# Patient Record
Sex: Female | Born: 1940 | Race: White | Hispanic: No | Smoking: Former smoker
Health system: Southern US, Community
[De-identification: ages and names within clinical notes are randomized; demographics above are authoritative.]

## PROBLEM LIST (undated history)

## (undated) DIAGNOSIS — E785 Hyperlipidemia, unspecified: Secondary | ICD-10-CM

## (undated) HISTORY — DX: Hyperlipidemia, unspecified: E78.5

## (undated) HISTORY — PX: CHOLECYSTECTOMY: SHX55

---

## 2015-12-05 ENCOUNTER — Encounter: Payer: Self-pay | Admitting: Obstetrics & Gynecology

## 2015-12-05 ENCOUNTER — Ambulatory Visit (INDEPENDENT_AMBULATORY_CARE_PROVIDER_SITE_OTHER): Payer: Medicare Other | Admitting: Obstetrics & Gynecology

## 2015-12-05 VITALS — BP 104/72 | HR 80 | Resp 16 | Ht 68.0 in | Wt 166.0 lb

## 2015-12-05 DIAGNOSIS — Z01419 Encounter for gynecological examination (general) (routine) without abnormal findings: Secondary | ICD-10-CM

## 2015-12-05 DIAGNOSIS — Z0000 Encounter for general adult medical examination without abnormal findings: Secondary | ICD-10-CM

## 2015-12-05 DIAGNOSIS — R5382 Chronic fatigue, unspecified: Secondary | ICD-10-CM

## 2015-12-05 NOTE — Progress Notes (Signed)
Subjective:    Gabrielle Torres is a 75 y.o. WW P3(51, 105, and 64 yo kids, 8 grands and 5 greats)  female who presents for an annual exam. The patient has no complaints today except for chronic constipation.  The patient is not currently sexually active. GYN screening history: last pap: was normal. The patient wears seatbelts: yes. The patient participates in regular exercise: yes. Has the patient ever been transfused or tattooed?: no. The patient reports that there is not domestic violence in her life.   Menstrual History: OB History    Gravida Para Term Preterm AB TAB SAB Ectopic Multiple Living   3         3      Menarche age: 80  No LMP recorded. Patient is postmenopausal.    The following portions of the patient's history were reviewed and updated as appropriate: allergies, current medications, past family history, past medical history, past social history, past surgical history and problem list.  Review of Systems Pertinent items noted in HPI and remainder of comprehensive ROS otherwise negative. Widowed for 2 years. Moved here from [place] to be with her son. S/p colonoscopy in [place].   Objective:    BP 104/72 mmHg  Pulse 80  Resp 16  Ht 5\' 8"  (1.727 m)  Wt 166 lb (75.297 kg)  BMI 25.25 kg/m2  General Appearance:    Alert, cooperative, no distress, appears stated age  Head:    Normocephalic, without obvious abnormality, atraumatic  Eyes:    PERRL, conjunctiva/corneas clear, EOM's intact, fundi    benign, both eyes  Ears:    Normal TM's and external ear canals, both ears  Nose:   Nares normal, septum midline, mucosa normal, no drainage    or sinus tenderness  Throat:   Lips, mucosa, and tongue normal; teeth and gums normal  Neck:   Supple, symmetrical, trachea midline, no adenopathy;    thyroid:  no enlargement/tenderness/nodules; no carotid   bruit or JVD  Back:     Symmetric, no curvature, ROM normal, no CVA tenderness  Lungs:     Clear to auscultation bilaterally, respirations  unlabored  Chest Wall:    No tenderness or deformity   Heart:    Regular rate and rhythm, S1 and S2 normal, no murmur, rub   or gallop  Breast Exam:    No tenderness, masses, or nipple abnormality  Abdomen:     Soft, non-tender, bowel sounds active all four quadrants,    no masses, no organomegaly  Genitalia:    Normal female without lesion, discharge or tenderness, moderate atrophy, NSSA, NT, mobile, no palpable adnexa/masses     Extremities:   Extremities normal, atraumatic, no cyanosis or edema  Pulses:   2+ and symmetric all extremities  Skin:   Skin color, texture, turgor normal, no rashes or lesions  Lymph nodes:   Cervical, supraclavicular, and axillary nodes normal  Neurologic:   CNII-XII intact, normal strength, sensation and reflexes    throughout  .    Assessment:    Healthy female exam.    Plan:     Breast self exam technique reviewed and patient encouraged to perform self-exam monthly. Mammogram. DEXA   Refer to GI for colorectal cancer screening

## 2015-12-06 ENCOUNTER — Other Ambulatory Visit: Payer: Medicare Other

## 2015-12-07 ENCOUNTER — Other Ambulatory Visit: Payer: Medicare Other

## 2015-12-12 ENCOUNTER — Other Ambulatory Visit: Payer: Medicare Other

## 2015-12-20 ENCOUNTER — Other Ambulatory Visit: Payer: Self-pay | Admitting: Obstetrics & Gynecology

## 2015-12-20 DIAGNOSIS — Z1231 Encounter for screening mammogram for malignant neoplasm of breast: Secondary | ICD-10-CM

## 2015-12-20 DIAGNOSIS — E2839 Other primary ovarian failure: Secondary | ICD-10-CM

## 2015-12-22 ENCOUNTER — Other Ambulatory Visit (INDEPENDENT_AMBULATORY_CARE_PROVIDER_SITE_OTHER): Payer: Medicare Other

## 2015-12-22 DIAGNOSIS — Z01419 Encounter for gynecological examination (general) (routine) without abnormal findings: Secondary | ICD-10-CM | POA: Diagnosis not present

## 2015-12-22 DIAGNOSIS — Z0000 Encounter for general adult medical examination without abnormal findings: Secondary | ICD-10-CM | POA: Diagnosis not present

## 2015-12-23 LAB — COMPREHENSIVE METABOLIC PANEL
ALK PHOS: 79 U/L (ref 33–130)
ALT: 17 U/L (ref 6–29)
AST: 22 U/L (ref 10–35)
Albumin: 4.2 g/dL (ref 3.6–5.1)
BUN: 16 mg/dL (ref 7–25)
CHLORIDE: 106 mmol/L (ref 98–110)
CO2: 26 mmol/L (ref 20–31)
CREATININE: 0.89 mg/dL (ref 0.60–0.93)
Calcium: 9.8 mg/dL (ref 8.6–10.4)
GLUCOSE: 89 mg/dL (ref 65–99)
POTASSIUM: 4.7 mmol/L (ref 3.5–5.3)
SODIUM: 141 mmol/L (ref 135–146)
Total Bilirubin: 0.6 mg/dL (ref 0.2–1.2)
Total Protein: 6.8 g/dL (ref 6.1–8.1)

## 2015-12-23 LAB — LIPID PANEL
CHOL/HDL RATIO: 4 ratio (ref <=5.0)
Cholesterol: 218 mg/dL — ABNORMAL HIGH (ref 125–200)
HDL: 55 mg/dL (ref >=46)
LDL Cholesterol: 120 mg/dL (ref <130)
Triglycerides: 215 mg/dL — ABNORMAL HIGH (ref <150)
VLDL: 43 mg/dL — AB (ref <30)

## 2015-12-23 LAB — CBC
HCT: 40 % (ref 36.0–46.0)
Hemoglobin: 13.3 g/dL (ref 12.0–15.0)
MCH: 30.4 pg (ref 26.0–34.0)
MCHC: 33.3 g/dL (ref 30.0–36.0)
MCV: 91.5 fL (ref 78.0–100.0)
MPV: 10.6 fL (ref 8.6–12.4)
PLATELETS: 195 10*3/uL (ref 150–400)
RBC: 4.37 MIL/uL (ref 3.87–5.11)
RDW: 14.2 % (ref 11.5–15.5)
WBC: 4.5 10*3/uL (ref 4.0–10.5)

## 2015-12-23 LAB — VITAMIN D 25 HYDROXY (VIT D DEFICIENCY, FRACTURES): VIT D 25 HYDROXY: 26 ng/mL — AB (ref 30–100)

## 2015-12-23 LAB — TSH: TSH: 4.29 u[IU]/mL (ref 0.350–4.500)

## 2015-12-26 ENCOUNTER — Telehealth: Payer: Self-pay | Admitting: *Deleted

## 2015-12-26 DIAGNOSIS — R7989 Other specified abnormal findings of blood chemistry: Secondary | ICD-10-CM

## 2015-12-26 MED ORDER — VITAMIN D (ERGOCALCIFEROL) 1.25 MG (50000 UNIT) PO CAPS: 50000.0000 [IU] | ORAL_CAPSULE | ORAL | Status: DC

## 2015-12-26 NOTE — Telephone Encounter (Signed)
-----   Message from Emily Filbert, MD sent at 12/26/2015 10:20 AM EST ----- She will need to see her FP about her lipids. She will need a prescription for Vitamin 50,000 weekly for 6 weeks. Thanks

## 2015-12-26 NOTE — Telephone Encounter (Signed)
Printed labs to send to pt - called pt to adv VitD at pharm and referred her to [facility] here in [place] for PCP follow up lipids. Pt expressed understanding.

## 2015-12-28 ENCOUNTER — Encounter: Payer: Self-pay | Admitting: Physician Assistant

## 2015-12-28 ENCOUNTER — Ambulatory Visit (INDEPENDENT_AMBULATORY_CARE_PROVIDER_SITE_OTHER): Payer: Medicare Other | Admitting: Physician Assistant

## 2015-12-28 VITALS — BP 108/61 | HR 71 | Ht 68.0 in | Wt 164.0 lb

## 2015-12-28 DIAGNOSIS — E785 Hyperlipidemia, unspecified: Secondary | ICD-10-CM | POA: Insufficient documentation

## 2015-12-28 DIAGNOSIS — E559 Vitamin D deficiency, unspecified: Secondary | ICD-10-CM | POA: Diagnosis not present

## 2015-12-28 DIAGNOSIS — Z23 Encounter for immunization: Secondary | ICD-10-CM | POA: Diagnosis not present

## 2015-12-28 DIAGNOSIS — Z1211 Encounter for screening for malignant neoplasm of colon: Secondary | ICD-10-CM

## 2015-12-28 MED ORDER — AMBULATORY NON FORMULARY MEDICATION: Status: DC

## 2015-12-28 MED ORDER — PRAVASTATIN SODIUM 40 MG PO TABS: 40.0000 mg | ORAL_TABLET | Freq: Every day | ORAL | Status: DC

## 2015-12-28 NOTE — Patient Instructions (Signed)
Varicella-Zoster Virus Vaccine Live injection  What is this medicine?  VARICELLA VIRUS VACCINE (var uh SEL uh VAHY ruhs vak SEEN) is used to prevent infections of chickenpox.  HERPES ZOSTER VIRUS VACCINE (HUR peez ZOS ter vahy ruhs vak SEEN) is used to prevent shingles in adults 73 years old and over. This vaccine is not used to treat shingles or nerve pain from shingles.  These medicines may be used for other purposes; ask your health care provider or pharmacist if you have questions.  This medicine may be used for other purposes; ask your health care provider or pharmacist if you have questions.  What should I tell my health care provider before I take this medicine?  They need to know if you have any of the following conditions:  -blood disorders or disease  -cancer like leukemia or lymphoma  -immune system problems or therapy  -infection with fever  -recent immune globulin therapy  -tuberculosis  -an unusual or allergic reaction to vaccines, neomycin, gelatin, other medicines, foods, dyes, or preservatives  -pregnant or trying to get pregnant  -breast-feeding  How should I use this medicine?  These vaccines are for injection under the skin. They are given by a health care professional.  A copy of Vaccine Information Statements will be given before each varicella virus vaccination. Read this sheet carefully each time. The sheet may change frequently. A Vaccine Information Statement is not given before the herpes zoster virus vaccine.  Talk to your pediatrician regarding the use of the varicella virus vaccine in children. While this drug may be prescribed for children as young as 12 months of age for selected conditions, precautions do apply. The herpes zoster virus vaccine is not approved in children.  Overdosage: If you think you have taken too much of this medicine contact a poison control center or emergency room at once.  NOTE: This medicine is only for you. Do not share this medicine with others.  What if I  miss a dose?  Keep appointments for follow-up (booster) doses of varicella virus vaccine as directed. It is important not to miss your dose. Call your doctor or health care professional if you are unable to keep an appointment.  Follow-up (booster) doses are not needed for the herpes zoster virus vaccine.  What may interact with this medicine?  Do not take these medicines with any of the following medications:  -adalimumab  -anakinra  -etanercept  -infliximab  -medicines that suppress your immune system  -medicines to treat cancer  These medicines may also interact with the following medications:  -aspirin and aspirin-like medicines (varicella virus vaccine only)  -blood transfusions (varicella virus vaccine only)  -immunoglobulins (varicella virus vaccine only)  -steroid medicines like prednisone or cortisone  This list may not describe all possible interactions. Give your health care provider a list of all the medicines, herbs, non-prescription drugs, or dietary supplements you use. Also tell them if you smoke, drink alcohol, or use illegal drugs. Some items may interact with your medicine.  What should I watch for while using this medicine?  Visit your doctor for regular check ups.  These vaccines, like all vaccines, may not fully protect everyone.  After receiving these vaccines it may be possible to pass chickenpox infection to others. For up to 6 weeks, avoid people with immune system problems, pregnant women who have not had chickenpox, newborns of women who have not had chickenpox, and all newborns born at less than 28 weeks of pregnancy. Talk to your   doctor for more information.  Do not become pregnant for 3 months after taking these vaccines. Women should inform their doctor if they wish to become pregnant or think they might be pregnant. There is a potential for serious side effects to an unborn child. Talk to your health care professional or pharmacist for more information.  What side effects may I  notice from receiving this medicine?  Side effects that you should report to your doctor or health care professional as soon as possible:  -allergic reactions like skin rash, itching or hives, swelling of the face, lips, or tongue  -breathing problems  -extreme changes in behavior  -feeling faint or lightheaded, falls  -fever over 102 degrees F  -pain, tingling, numbness in the hands or feet  -redness, blistering, peeling or loosening of the skin, including inside the mouth  -seizures  -unusually weak or tired  Side effects that usually do not require medical attention (report to your doctor or health care professional if they continue or are bothersome):  -aches or pains  -chickenpox-like rash  -diarrhea  -headache  -low-grade fever under 102 degrees F  -loss of appetite  -nausea, vomiting  -redness, pain, swelling at site where injected  -sleepy  -trouble sleeping  This list may not describe all possible side effects. Call your doctor for medical advice about side effects. You may report side effects to FDA at 1-800-FDA-1088.  Where should I keep my medicine?  These drugs are given in a hospital or clinic and will not be stored at home.  NOTE: This sheet is a summary. It may not cover all possible information. If you have questions about this medicine, talk to your doctor, pharmacist, or health care provider.     © 2016, Elsevier/Gold Standard. (2013-07-24 14:24:35)

## 2015-12-28 NOTE — Progress Notes (Signed)
   Subjective:    Patient ID: Gabrielle Torres, female    DOB: [birth year], 75 y.o.   MRN: [id number]  HPI Pt is a 75 yo female who presents to the clinic to establish care. She was recently seen by GYN and found to have elevated LDL. She would like to discuss today.   .. Active Ambulatory Problems    Diagnosis Date Noted  . Vitamin D deficiency 12/28/2015  . Hyperlipidemia 12/28/2015   Resolved Ambulatory Problems    Diagnosis Date Noted  . No Resolved Ambulatory Problems   No Additional Past Medical History   .Marland Kitchen Family History  Problem Relation Age of Onset  . Breast cancer Mother 24  . Hypertension Son   . Hypertension Paternal Aunt   . Depression Father   . Suicidality Father    .Marland Kitchen Social History   Social History  . Marital Status: Widowed    Spouse Name: N/A  . Number of Children: N/A  . Years of Education: N/A   Occupational History  . Not on file.   Social History Main Topics  . Smoking status: Never Smoker   . Smokeless tobacco: Not on file  . Alcohol Use: No     Comment: Occasional  . Drug Use: No  . Sexual Activity: No   Other Topics Concern  . Not on file   Social History Narrative      Review of Systems  All other systems reviewed and are negative.      Objective:   Physical Exam  Constitutional: She is oriented to person, place, and time. She appears well-developed and well-nourished.  HENT:  Head: Normocephalic and atraumatic.  Neck: Normal range of motion. Neck supple.  Cardiovascular: Normal rate, regular rhythm and normal heart sounds.   Pulmonary/Chest: Effort normal and breath sounds normal. She has no wheezes.  Lymphadenopathy:    She has no cervical adenopathy.  Neurological: She is alert and oriented to person, place, and time.  Skin: Skin is dry.  Psychiatric: She has a normal mood and affect. Her behavior is normal.          Assessment & Plan:  Hyperlipidemia- CV 10 yr risk 8.7 percent. Started on pravachol daily.  Discussed side effects. Follow up in 1 years. Low fat diet and exercise encouraged.   Vitamin d def- on high dose vitamin d. Recheck in 3 months.   tdap and prevnar 13 given today.   zostavax immunization order given to have done 4 weeks at least after prevnar.   mammo and dexa ordered.   Sent referral for colonoscopy.

## 2015-12-29 ENCOUNTER — Ambulatory Visit (INDEPENDENT_AMBULATORY_CARE_PROVIDER_SITE_OTHER): Payer: Medicare Other

## 2015-12-29 DIAGNOSIS — Z780 Asymptomatic menopausal state: Secondary | ICD-10-CM | POA: Diagnosis not present

## 2015-12-29 DIAGNOSIS — E2839 Other primary ovarian failure: Secondary | ICD-10-CM

## 2015-12-29 DIAGNOSIS — Z1231 Encounter for screening mammogram for malignant neoplasm of breast: Secondary | ICD-10-CM

## 2015-12-29 DIAGNOSIS — M8588 Other specified disorders of bone density and structure, other site: Secondary | ICD-10-CM | POA: Diagnosis not present

## 2015-12-29 DIAGNOSIS — M8580 Other specified disorders of bone density and structure, unspecified site: Secondary | ICD-10-CM | POA: Diagnosis not present

## 2016-02-02 DIAGNOSIS — D123 Benign neoplasm of transverse colon: Secondary | ICD-10-CM | POA: Diagnosis not present

## 2016-02-02 DIAGNOSIS — D122 Benign neoplasm of ascending colon: Secondary | ICD-10-CM | POA: Diagnosis not present

## 2016-02-02 DIAGNOSIS — D120 Benign neoplasm of cecum: Secondary | ICD-10-CM | POA: Diagnosis not present

## 2016-02-02 DIAGNOSIS — Z1211 Encounter for screening for malignant neoplasm of colon: Secondary | ICD-10-CM | POA: Diagnosis not present

## 2016-02-02 LAB — HM COLONOSCOPY

## 2016-02-07 ENCOUNTER — Telehealth: Payer: Self-pay | Admitting: *Deleted

## 2016-02-07 DIAGNOSIS — E559 Vitamin D deficiency, unspecified: Secondary | ICD-10-CM

## 2016-02-07 NOTE — Telephone Encounter (Signed)
Labs ordered to recheck vit d since pt has finished weekly rx.

## 2016-02-08 DIAGNOSIS — E559 Vitamin D deficiency, unspecified: Secondary | ICD-10-CM | POA: Diagnosis not present

## 2016-02-09 LAB — VITAMIN D 25 HYDROXY (VIT D DEFICIENCY, FRACTURES): Vit D, 25-Hydroxy: 37 ng/mL (ref 30–100)

## 2016-02-29 ENCOUNTER — Encounter: Payer: Self-pay | Admitting: Physician Assistant

## 2016-05-07 ENCOUNTER — Ambulatory Visit (INDEPENDENT_AMBULATORY_CARE_PROVIDER_SITE_OTHER): Payer: Medicare Other | Admitting: Physician Assistant

## 2016-05-07 ENCOUNTER — Encounter: Payer: Self-pay | Admitting: Physician Assistant

## 2016-05-07 VITALS — BP 99/54 | HR 72 | Ht 68.0 in | Wt 169.0 lb

## 2016-05-07 DIAGNOSIS — E785 Hyperlipidemia, unspecified: Secondary | ICD-10-CM | POA: Diagnosis not present

## 2016-05-07 DIAGNOSIS — G5701 Lesion of sciatic nerve, right lower limb: Secondary | ICD-10-CM | POA: Diagnosis not present

## 2016-05-07 DIAGNOSIS — Z780 Asymptomatic menopausal state: Secondary | ICD-10-CM

## 2016-05-07 DIAGNOSIS — D126 Benign neoplasm of colon, unspecified: Secondary | ICD-10-CM

## 2016-05-07 DIAGNOSIS — E559 Vitamin D deficiency, unspecified: Secondary | ICD-10-CM

## 2016-05-07 MED ORDER — IBUPROFEN 600 MG PO TABS: 600.0000 mg | ORAL_TABLET | Freq: Three times a day (TID) | ORAL | Status: DC | PRN

## 2016-05-07 NOTE — Patient Instructions (Signed)
Piriformis Syndrome With Rehab Piriformis syndrome is a condition the affects the nervous system in the area of the hip, and is characterized by pain and possibly a loss of feeling in the backside (posterior) thigh that may extend down the entire length of the leg. The symptoms are caused by an increase in pressure on the sciatic nerve by the piriformis muscle, which is on the back of the hip and is responsible for externally rotating the hip. The sciatic nerve and its branches connect to much of the leg. Normally the sciatic nerve runs between the piriformis muscle and other muscles. However, in certain individuals the nerve runs through the muscle, which causes an increase in pressure on the nerve and results in the symptoms of piriformis syndrome. SYMPTOMS   Pain, tingling, numbness, or burning in the back of the thigh that may also extend down the entire leg.  Occasionally, tenderness in the buttock.  Loss of function of the leg.  Pain that worsens when using the piriformis muscle (running, jumping, or stairs).  Pain that increases with prolonged sitting.  Pain that is lessened by lying flat on the back. CAUSES   Piriformis syndrome is the result of an increase in pressure placed on the sciatic nerve. Oftentimes, piriformis syndrome is an overuse injury.  Stress placed on the nerve from a sudden increase in the intensity, frequency, or duration of training.  Compensation of other extremity injuries. RISK INCREASES WITH:  Sports that involve the piriformis muscle (running, walking, or jumping).  You are born with (congenital) a defect in which the sciatic nerve passes through the muscle. PREVENTION  Warm up and stretch properly before activity.  Allow for adequate recovery between workouts.  Maintain physical fitness:  Strength, flexibility, and endurance.  Cardiovascular fitness. PROGNOSIS  If treated properly, the symptoms of piriformis syndrome usually resolve in 2 to 6  weeks. RELATED COMPLICATIONS   Persistent and possibly permanent pain and numbness in the lower extremity.  Weakness of the extremity that may progress to disability and inability to compete. TREATMENT  The most effective treatment for piriformis syndrome is rest from any activities that aggravate the symptoms. Ice and pain medication may help reduce pain and inflammation. The use of strengthening and stretching exercises may help reduce pain with activity. These exercises may be performed at home or with a therapist. A referral to a therapist may be given for further evaluation and treatment, such as ultrasound. Corticosteroid injections may be given to reduce inflammation that is causing pressure to be placed on the sciatic nerve. If nonsurgical (conservative) treatment is unsuccessful, then surgery may be recommended.  MEDICATION   If pain medication is necessary, then nonsteroidal anti-inflammatory medications, such as aspirin and ibuprofen, or other minor pain relievers, such as acetaminophen, are often recommended.  Do not take pain medication for 7 days before surgery.  Prescription pain relievers may be given if deemed necessary by your caregiver. Use only as directed and only as much as you need.  Corticosteroid injections may be given by your caregiver. These injections should be reserved for the most serious cases, because they may only be given a certain number of times. HEAT AND COLD:   Cold treatment (icing) relieves pain and reduces inflammation. Cold treatment should be applied for 10 to 15 minutes every 2 to 3 hours for inflammation and pain and immediately after any activity that aggravates your symptoms. Use ice packs or massage the area with a piece of ice (ice massage).  Heat   treatment may be used prior to performing the stretching and strengthening activities prescribed by your caregiver, physical therapist, or athletic trainer. Use a heat pack or soak the injury in warm  water. SEEK IMMEDIATE MEDICAL CARE IF:  Treatment seems to offer no benefit, or the condition worsens.  Any medications produce adverse side effects. EXERCISES RANGE OF MOTION (ROM) AND STRETCHING EXERCISES - Piriformis Syndrome These exercises may help you when beginning to rehabilitate your injury. Your symptoms may resolve with or without further involvement from your physician, physical therapist, or athletic trainer. While completing these exercises, remember:   Restoring tissue flexibility helps normal motion to return to the joints. This allows healthier, less painful movement and activity.  An effective stretch should be held for at least 30 seconds.  A stretch should never be painful. You should only feel a gentle lengthening or release in the stretched tissue. STRETCH - Hip Rotators  Lie on your back on a firm surface. Grasp your right / left knee with your right / left hand and your ankle with your opposite hand.  Keeping your hips and shoulders firmly planted, gently pull your right / left knee and rotate your lower leg toward your opposite shoulder until you feel a stretch in your buttocks.  Hold this stretch for __________ seconds. Repeat this stretch __________ times. Complete this stretch __________ times per day. STRETCH - Iliotibial Band  On the floor or bed, lie on your side so your right / left leg is on top. Bend your knee and grab your ankle.  Slowly bring your knee back so that your thigh is in line with your trunk. Keep your heel at your buttocks and gently arch your back so your head, shoulders, and hips line up.  Slowly lower your leg so that your knee approaches the floor/bed until you feel a gentle stretch on the outside of your right / left thigh. If you do not feel a stretch and your knee will not fall farther, place the heel of your opposite foot on top of your knee and pull your thigh down farther.  Hold this stretch for __________ seconds. Repeat  __________ times. Complete __________ times per day. STRENGTHENING EXERCISES - Piriformis Syndrome  These are some of the caregiver again or until your symptoms are resolved. Remember:   Strong muscles with good endurance tolerate stress better.  Do the exercises as initially prescribed by your caregiver. Progress slowly with each exercise, gradually increasing the number of repetitions and weight used under their guidance. STRENGTH - Hip Abductors, Straight Leg Raises Be aware of your form throughout the entire exercise so that you exercise the correct muscles. Sloppy form means that you are not strengthening the correct muscles.  Lie on your side so that your head, shoulders, knee, and hip line up. You may bend your lower knee to help maintain your balance. Your right / left leg should be on top.  Roll your hips slightly forward, so that your hips are stacked directly over each other and your right / left knee is facing forward.  Lift your top leg up 4-6 inches, leading with your heel. Be sure that your foot does not drift forward or that your knee does not roll toward the ceiling.  Hold this position for __________ seconds. You should feel the muscles in your outer hip lifting (you may not notice this until your leg begins to tire).  Slowly lower your leg to the starting position. Allow the muscles to fully   relax before beginning the next repetition. Repeat __________ times. Complete this exercise __________ times per day.  STRENGTH - Hip Abductors, Quadruped  On a firm, lightly padded surface, position yourself on your hands and knees. Your hands should be directly below your shoulders and your knees should be directly below your hips.  Keeping your right / left knee bent, lift your leg out to the side. Keep your legs level and in line with your shoulders.  Position yourself on your hands and knees.  Hold for __________ seconds.  Keeping your trunk steady and your hips level, slowly  lower your leg to the starting position. Repeat __________ times. Complete this exercise __________ times per day.  STRENGTH - Hip Abductors, Standing  Tie one end of a rubber exercise band/tubing to a secure surface (table, pole) and tie a loop at the other end.  Place the loop around your right / left ankle. Keeping your ankle with the band directly opposite of the secured end, step away until there is tension in the tube/band.  Hold onto a chair as needed for balance.  Keeping your back upright, your shoulders over your hips, and your toes pointing forward, lift your right / left leg out to your side. Be sure to lift your leg with your hip muscles. Do not "throw" your leg or tip your body to lift your leg.  Slowly and with control, return to the starting position. Repeat exercise __________ times. Complete this exercise __________ times per day.    This information is not intended to replace advice given to you by your health care provider. Make sure you discuss any questions you have with your health care provider.   Document Released: 11/19/2005 Document Revised: 04/05/2015 Document Reviewed: 03/03/2009 Elsevier Interactive Patient Education 2016 [employer]vier Inc.  

## 2016-05-08 DIAGNOSIS — E785 Hyperlipidemia, unspecified: Secondary | ICD-10-CM | POA: Diagnosis not present

## 2016-05-08 DIAGNOSIS — Z780 Asymptomatic menopausal state: Secondary | ICD-10-CM | POA: Diagnosis not present

## 2016-05-08 DIAGNOSIS — E559 Vitamin D deficiency, unspecified: Secondary | ICD-10-CM | POA: Diagnosis not present

## 2016-05-08 LAB — LIPID PANEL
CHOLESTEROL: 172 mg/dL (ref 125–200)
HDL: 60 mg/dL (ref >=46)
LDL CALC: 73 mg/dL (ref <130)
TRIGLYCERIDES: 196 mg/dL — AB (ref <150)
Total CHOL/HDL Ratio: 2.9 Ratio (ref <=5.0)
VLDL: 39 mg/dL — AB (ref <30)

## 2016-05-09 DIAGNOSIS — G5701 Lesion of sciatic nerve, right lower limb: Secondary | ICD-10-CM | POA: Insufficient documentation

## 2016-05-09 DIAGNOSIS — Z780 Asymptomatic menopausal state: Secondary | ICD-10-CM | POA: Insufficient documentation

## 2016-05-09 LAB — VITAMIN D 25 HYDROXY (VIT D DEFICIENCY, FRACTURES): Vit D, 25-Hydroxy: 42 ng/mL (ref 30–100)

## 2016-05-09 NOTE — Progress Notes (Signed)
   Subjective:    Patient ID: Gabrielle Torres, female    DOB: August 10, 1941, 75 y.o.   MRN: [id number]  HPI Pt is a 75 yo female who presents to the clinic with 1 month of "right hip pain". She reports pain in right buttocks and lateral hip. Pain does not radiate into legs. No numbness or tingling. Right leg does not give out. Patient is very active and walks a lot. 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 Pain seems to be the worse after sitting for long periods of time. Not tried anything to make better.   She started pravachol a few months ago and wants labs rechecked.    Review of Systems  All other systems reviewed and are negative.      Objective:   Physical Exam  Constitutional: She appears well-developed and well-nourished.  HENT:  Head: Normocephalic and atraumatic.  Musculoskeletal:  Very mild tenderness with palpation over greater trochanteric bursa of right hip.  More moderate pain just behind bursa at insertion of piriformis muscle.  Negative straight leg test.  No tenderness over lumbar spine.  Mild discomfort with external ROM of right hip.  5/5 strength bilateral lower extremity.   Psychiatric: She has a normal mood and affect. Her behavior is normal.          Assessment & Plan:  Right piriformis muscle strain- hx also positive for some mild greater trochanteric bursitis. gave HO on stretches to do twice a day. Ice area. Ibuprofen regularly for next 2 weeks. biofreeze encouraged to use. Could benefit from sitting on tennis ball and letting roll over buttocks. Consider formal PT. Call office if no improvement in next 2-3 weeks.   Hyperlipidemia- on pravachol now. Recheck lipid level.   Vitamin D def- recheck vitamin d level.   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 Follow up in 3 years.

## 2016-05-23 ENCOUNTER — Encounter: Payer: Self-pay | Admitting: Physician Assistant

## 2017-01-01 ENCOUNTER — Other Ambulatory Visit: Payer: Self-pay | Admitting: Physician Assistant

## 2017-01-09 ENCOUNTER — Ambulatory Visit (INDEPENDENT_AMBULATORY_CARE_PROVIDER_SITE_OTHER): Payer: Medicare Other | Admitting: Physician Assistant

## 2017-01-09 ENCOUNTER — Encounter: Payer: Self-pay | Admitting: Physician Assistant

## 2017-01-09 VITALS — BP 101/68 | HR 94 | Ht 68.0 in | Wt 157.0 lb

## 2017-01-09 DIAGNOSIS — Z780 Asymptomatic menopausal state: Secondary | ICD-10-CM

## 2017-01-09 DIAGNOSIS — Z0000 Encounter for general adult medical examination without abnormal findings: Secondary | ICD-10-CM

## 2017-01-09 DIAGNOSIS — Z23 Encounter for immunization: Secondary | ICD-10-CM

## 2017-01-09 DIAGNOSIS — E781 Pure hyperglyceridemia: Secondary | ICD-10-CM | POA: Insufficient documentation

## 2017-01-09 DIAGNOSIS — E7800 Pure hypercholesterolemia, unspecified: Secondary | ICD-10-CM | POA: Diagnosis not present

## 2017-01-09 LAB — COMPLETE METABOLIC PANEL WITH GFR
ALT: 15 U/L (ref 6–29)
AST: 22 U/L (ref 10–35)
Albumin: 4.3 g/dL (ref 3.6–5.1)
Alkaline Phosphatase: 80 U/L (ref 33–130)
BUN: 17 mg/dL (ref 7–25)
CALCIUM: 9.8 mg/dL (ref 8.6–10.4)
CHLORIDE: 107 mmol/L (ref 98–110)
CO2: 24 mmol/L (ref 20–31)
CREATININE: 1.08 mg/dL — AB (ref 0.60–0.93)
GFR, EST AFRICAN AMERICAN: 59 mL/min — AB (ref >=60)
GFR, EST NON AFRICAN AMERICAN: 51 mL/min — AB (ref >=60)
Glucose, Bld: 81 mg/dL (ref 65–99)
Potassium: 4.3 mmol/L (ref 3.5–5.3)
Sodium: 141 mmol/L (ref 135–146)
Total Bilirubin: 0.6 mg/dL (ref 0.2–1.2)
Total Protein: 7 g/dL (ref 6.1–8.1)

## 2017-01-09 LAB — LIPID PANEL
CHOLESTEROL: 162 mg/dL (ref <200)
HDL: 71 mg/dL (ref >50)
LDL CALC: 66 mg/dL (ref <100)
TRIGLYCERIDES: 125 mg/dL (ref <150)
Total CHOL/HDL Ratio: 2.3 Ratio (ref <5.0)
VLDL: 25 mg/dL (ref <30)

## 2017-01-09 MED ORDER — PRAVASTATIN SODIUM 40 MG PO TABS: 40.0000 mg | ORAL_TABLET | Freq: Every day | ORAL | 4 refills | Status: DC

## 2017-01-09 NOTE — Progress Notes (Signed)
   Subjective:    Patient ID: Gabrielle Torres, female    DOB: 07-Nov-1941, 76 y.o.   MRN: [id number]  HPI Pt is a 76 yo female who presents to the clinic for medication refill. She is almost out of her Pravachol. She is doing well with no complaints. She has started fish oil for elevated TG.   .. Active Ambulatory Problems    Diagnosis Date Noted  . Vitamin D deficiency 12/28/2015  . Hyperlipidemia 12/28/2015  . Adenomatous polyp of colon 05/07/2016  . Post-menopausal 05/09/2016  . Piriformis syndrome of right side 05/09/2016  . Hypertriglyceridemia 01/09/2017   Resolved Ambulatory Problems    Diagnosis Date Noted  . No Resolved Ambulatory Problems   No Additional Past Medical History   .Marland KitchenBreast Cancer-relatedfamily history includes Breast cancer (age of onset: 66) in her mother.   .. Social History   Social History  . Marital status: Widowed    Spouse name: N/A  . Number of children: N/A  . Years of education: N/A   Occupational History  . Not on file.   Social History Main Topics  . Smoking status: Never Smoker  . Smokeless tobacco: Never Used  . Alcohol use No     Comment: Occasional  . Drug use: No  . Sexual activity: No   Other Topics Concern  . Not on file   Social History Narrative  . No narrative on file     Review of Systems  All other systems reviewed and are negative.      Objective:   Physical Exam  Constitutional: She is oriented to person, place, and time. She appears well-developed and well-nourished.  HENT:  Head: Normocephalic and atraumatic.  Neck: Normal range of motion. Neck supple.  Cardiovascular: Normal rate, regular rhythm and normal heart sounds.   Pulmonary/Chest: Effort normal and breath sounds normal.  Lymphadenopathy:    She has no cervical adenopathy.  Neurological: She is alert and oriented to person, place, and time.  Psychiatric: She has a normal mood and affect. Her behavior is normal.          Assessment & Plan:   .Marland KitchenZaylie was seen today for hyperlipidemia.  Diagnoses and all orders for this visit:  Preventative health care  Pure hypercholesterolemia -     Lipid panel  Hypertriglyceridemia -     Lipid panel -     COMPLETE METABOLIC PANEL WITH GFR  Post-menopausal  Influenza vaccine needed -     Flu Vaccine QUAD 36+ mos PF IM (Fluarix & Fluzone Quad PF)  Need for pneumococcal vaccination -     Pneumococcal polysaccharide vaccine 23-valent greater than or equal to 2yo subcutaneous/IM  Other orders -     pravastatin (PRAVACHOL) 40 MG tablet; Take 1 tablet (40 mg total) by mouth daily.    pravachol refilled for 1 year.  Dicussed taking baby ASA 81mg  for heart health.  Vaccines up date

## 2017-01-09 NOTE — Patient Instructions (Signed)

## 2017-01-24 ENCOUNTER — Telehealth: Payer: Self-pay | Admitting: *Deleted

## 2017-01-24 DIAGNOSIS — R944 Abnormal results of kidney function studies: Secondary | ICD-10-CM

## 2017-01-24 NOTE — Telephone Encounter (Signed)
CMP ordered to recheck kidney function.

## 2017-02-05 DIAGNOSIS — R944 Abnormal results of kidney function studies: Secondary | ICD-10-CM | POA: Diagnosis not present

## 2017-02-05 LAB — COMPLETE METABOLIC PANEL WITH GFR
ALT: 13 U/L (ref 6–29)
AST: 17 U/L (ref 10–35)
Albumin: 4.3 g/dL (ref 3.6–5.1)
Alkaline Phosphatase: 88 U/L (ref 33–130)
BILIRUBIN TOTAL: 0.4 mg/dL (ref 0.2–1.2)
BUN: 17 mg/dL (ref 7–25)
CHLORIDE: 106 mmol/L (ref 98–110)
CO2: 27 mmol/L (ref 20–31)
CREATININE: 1.01 mg/dL — AB (ref 0.60–0.93)
Calcium: 9.4 mg/dL (ref 8.6–10.4)
GFR, Est African American: 64 mL/min (ref >=60)
GFR, Est Non African American: 55 mL/min — ABNORMAL LOW (ref >=60)
GLUCOSE: 88 mg/dL (ref 65–99)
Potassium: 4.4 mmol/L (ref 3.5–5.3)
Sodium: 140 mmol/L (ref 135–146)
TOTAL PROTEIN: 7 g/dL (ref 6.1–8.1)

## 2017-02-06 ENCOUNTER — Encounter: Payer: Self-pay | Admitting: Physician Assistant

## 2017-02-06 DIAGNOSIS — N183 Chronic kidney disease, stage 3 unspecified: Secondary | ICD-10-CM | POA: Insufficient documentation

## 2017-02-06 DIAGNOSIS — N1831 Chronic kidney disease, stage 3a: Secondary | ICD-10-CM | POA: Insufficient documentation

## 2017-02-07 ENCOUNTER — Other Ambulatory Visit: Payer: Self-pay

## 2017-02-07 DIAGNOSIS — E785 Hyperlipidemia, unspecified: Secondary | ICD-10-CM

## 2017-02-07 MED ORDER — PRAVASTATIN SODIUM 40 MG PO TABS: 40.0000 mg | ORAL_TABLET | Freq: Every day | ORAL | 4 refills | Status: DC

## 2017-02-14 ENCOUNTER — Emergency Department (INDEPENDENT_AMBULATORY_CARE_PROVIDER_SITE_OTHER): Admission: EM | Admit: 2017-02-14 | Discharge: 2017-02-14 | Payer: Medicare Other | Source: Home / Self Care

## 2017-02-14 ENCOUNTER — Encounter: Payer: Self-pay | Admitting: Emergency Medicine

## 2017-02-14 DIAGNOSIS — R410 Disorientation, unspecified: Secondary | ICD-10-CM | POA: Diagnosis not present

## 2017-02-14 DIAGNOSIS — E785 Hyperlipidemia, unspecified: Secondary | ICD-10-CM | POA: Diagnosis not present

## 2017-02-14 DIAGNOSIS — R404 Transient alteration of awareness: Secondary | ICD-10-CM | POA: Diagnosis not present

## 2017-02-14 DIAGNOSIS — R531 Weakness: Secondary | ICD-10-CM | POA: Diagnosis not present

## 2017-02-14 DIAGNOSIS — F419 Anxiety disorder, unspecified: Secondary | ICD-10-CM | POA: Diagnosis not present

## 2017-02-14 DIAGNOSIS — R251 Tremor, unspecified: Secondary | ICD-10-CM | POA: Diagnosis not present

## 2017-02-14 DIAGNOSIS — R42 Dizziness and giddiness: Secondary | ICD-10-CM | POA: Diagnosis not present

## 2017-02-14 DIAGNOSIS — R8299 Other abnormal findings in urine: Secondary | ICD-10-CM | POA: Diagnosis not present

## 2017-02-14 LAB — POCT FASTING CBG KUC MANUAL ENTRY: POCT Glucose (KUC): 115 mg/dL — AB (ref 70–99)

## 2017-02-14 NOTE — ED Triage Notes (Signed)
Dizziness and shakiness started 30 minutes ago, my thoughts are coming slowly and she's not feeling sure of her thoughts, not sure if she gave the right phone number. Daughter-in-law said she is not acting like herself

## 2017-02-15 ENCOUNTER — Telehealth: Payer: Self-pay | Admitting: Emergency Medicine

## 2017-02-15 NOTE — Telephone Encounter (Signed)
Called patient she went to hospital, had tests done everything was negative, dx panic attack, she said she needs to learn to manage her emotions. Feeling fine today. Appreciates our help last night

## 2017-02-19 NOTE — ED Provider Notes (Signed)
[facility]    CSN: [id number] Arrival date & time: 02/14/17  1858     History   Chief Complaint Chief Complaint  Patient presents with  . Dizziness    HPI Gabrielle Torres is a 76 y.o. female.   HPI Family brings in pt to [facility] 7 PM. Acute onset of dizziness and shakiness started 30 minutes ago, "my thoughts are coming slowly" and she's not feeling sure of her thoughts, not sure if she gave the right phone number. Daughter-in-law said she is not acting like herself and not talking normally. She has a vague sense of weakness but no focal weakness that she can describe. She feels numbness and tingling in all 4 limbs. She feels as if she could faint, but no syncope. No chest pain or shortness of breath. She is not sure what could he causing this. There've been some stressors in that to pets have been sick. There is no known history of any psychiatric abnormality. No history of anxiety or conversion reaction in the past. She has a vague nonfocal headache. She's not sure if her vision has changed. She has not tried any medicine or any particular treatment for this. Denies history of insect bite or foreign travel.  History reviewed. No pertinent past medical history. Past medical history reviewed: Patient Active Problem List   Diagnosis Date Noted  . CKD (chronic kidney disease) stage 3, GFR 30-59 ml/min 02/06/2017  . Hypertriglyceridemia 01/09/2017  . Post-menopausal 05/09/2016  . Piriformis syndrome of right side 05/09/2016  . Adenomatous polyp of colon 05/07/2016  . Vitamin D deficiency 12/28/2015  . Hyperlipidemia 12/28/2015  No prior history of similar symptoms. No history of CVA or TIA in the past.  Past Surgical History:  Procedure Laterality Date  . CHOLECYSTECTOMY      OB History    Gravida Para Term Preterm AB Living   3         3   SAB TAB Ectopic Multiple Live Births           3       Home Medications    Prior to Admission  medications   Medication Sig Start Date End Date Taking? Authorizing Provider  AMBULATORY NON FORMULARY MEDICATION zostavax IM once for shingles prevention. 12/28/15   Jade L Breeback, PA-C  calcium-vitamin D (OSCAL WITH D) 500-200 MG-UNIT tablet Take 1 tablet by mouth.    Historical Provider, MD  CRANBERRY EXTRACT PO Take by mouth.    Historical Provider, MD  Docusate Calcium (STOOL SOFTENER PO) Take by mouth.    Historical Provider, MD  ibuprofen (ADVIL,MOTRIN) 600 MG tablet Take 1 tablet (600 mg total) by mouth every 8 (eight) hours as needed. 05/07/16   Jade L Breeback, PA-C  pravastatin (PRAVACHOL) 40 MG tablet Take 1 tablet (40 mg total) by mouth daily. 02/07/17   Donella Stade, PA-C    Family History Family History  Problem Relation Age of Onset  . Depression Father   . Suicidality Father   . Breast cancer Mother 23  . Hypertension Son   . Hypertension Paternal Aunt     Social History Social History  Substance Use Topics  . Smoking status: Never Smoker  . Smokeless tobacco: Never Used  . Alcohol use No     Comment: Occasional     Allergies   Patient has no known allergies.   Review of Systems Review of Systems  Constitutional: Positive for fatigue. Negative for fever.  HENT: Negative.   Eyes: Positive for visual disturbance (Possibly, she is not sure). Negative for photophobia, pain, discharge, redness and itching.  Respiratory: Negative for shortness of breath.   Cardiovascular: Negative for chest pain, palpitations and leg swelling.  Gastrointestinal: Negative for abdominal distention, abdominal pain, blood in stool, diarrhea, nausea and vomiting.  Genitourinary: Negative.  Negative for dysuria, hematuria and vaginal bleeding.  Musculoskeletal: Positive for arthralgias, gait problem and myalgias. Negative for neck pain and neck stiffness.  Skin: Negative for rash.  Neurological: Positive for dizziness, tremors, speech difficulty, weakness, light-headedness and  numbness. Negative for seizures, facial asymmetry and headaches.  Psychiatric/Behavioral: Positive for agitation and confusion. Negative for hallucinations and suicidal ideas. The patient is nervous/anxious.   All other systems reviewed and are negative.    Physical Exam Triage Vital Signs ED Triage Vitals  Enc Vitals Group     BP 02/14/17 1928 125/79     Pulse Rate 02/14/17 1928 76     Resp --      Temp 02/14/17 1928 97.5 F (36.4 C)     Temp Source 02/14/17 1928 Oral     SpO2 02/14/17 1928 98 %     Weight 02/14/17 1928 142 lb (64.4 kg)     Height 02/14/17 1928 5\' 8"  (1.727 m)     Head Circumference --      Peak Flow --      Pain Score 02/14/17 1929 0     Pain Loc --      Pain Edu? --      Excl. in [place]? --    No data found.   Updated Vital Signs BP 125/79 (BP Location: Left Arm)   Pulse 76   Temp 97.5 F (36.4 C) (Oral)   Ht 5\' 8"  (1.727 m)   Wt 142 lb (64.4 kg)   SpO2 98%   BMI 21.59 kg/m   Visual Acuity Right Eye Distance:   Left Eye Distance:   Bilateral Distance:    Right Eye Near:   Left Eye Near:    Bilateral Near:     Physical Exam  Constitutional: She appears well-developed and well-nourished. She appears distressed (She appears shaky with tremors in all 4 extremities.Marland Kitchen Appears in distress but no definite cardiorespiratory distress.).  HENT:  Head: Normocephalic and atraumatic.  Mouth/Throat: Oropharynx is clear and moist.  Eyes: EOM are normal. Pupils are equal, round, and reactive to light. Right eye exhibits no discharge.  Neck: Normal range of motion. Neck supple. No JVD present. No tracheal deviation present.  Cardiovascular: Normal rate, regular rhythm and normal heart sounds.   Pulmonary/Chest: Effort normal and breath sounds normal.  Abdominal: Soft. She exhibits no distension and no mass. There is no tenderness. There is no rebound and no guarding.  Musculoskeletal: Normal range of motion. She exhibits no edema.  Neurological: She is alert.  No cranial nerve deficit or sensory deficit. She displays no seizure activity. Coordination abnormal. She displays no Babinski's sign on the right side. She displays no Babinski's sign on the left side.  Reflex Scores:      Bicep reflexes are 2+ on the right side and 2+ on the left side. Somewhat confused. Oriented to name, place, but not to time or date. Strength difficult to assess, but no definite motor deficit. SPEECH: PRESSURED, GARBLED at times.  Skin: Capillary refill takes 2 to 3 seconds. No rash noted. She is not diaphoretic. There is pallor.  Nursing note and vitals reviewed.  UC Treatments / Results  Labs (all labs ordered are listed, but only abnormal results are displayed) Labs Reviewed  POCT FASTING CBG [facility] MANUAL ENTRY - Abnormal; Notable for the following:       Result Value   POCT Glucose ([facility]) 115 (*)    All other components within normal limits    EKG  EKG Interpretation None       Radiology No results found.  Procedures Procedures (including critical care time)  Medications Ordered in UC Medications - No data to display   Initial Impression / Assessment and Plan / UC Course  I have reviewed the triage vital signs and the nursing notes.  Pertinent labs & imaging results that were available during my care of the patient were reviewed by me and considered in my medical decision making (see chart for details).  Clinical Course as of Feb 19 1641  Tue Feb 19, 2017  1640 POCT Glucose [facility]): Marland Kitchen 115 [DM]    Clinical Course User Index [DM] Jacqulyn Cane, MD      Final Clinical Impressions(s) / UC Diagnoses   Final diagnoses:  Dizziness  Vague constellation of acute symptoms of shakiness,, dysarthria, vague feeling of dizziness and restlessness. Need to rule out acute neurologic event, therefore EMS called and arrived and transported patient to [facility]. Is possible that the symptoms were related to anxiety or conversion reaction, but that  would be a diagnosis of exclusion.  Explained to patient and family, who agree with stat EMS transport to hospital.     Jacqulyn Cane, MD 02/19/17 1655

## 2017-04-08 ENCOUNTER — Ambulatory Visit (INDEPENDENT_AMBULATORY_CARE_PROVIDER_SITE_OTHER): Payer: Medicare Other | Admitting: Physician Assistant

## 2017-04-08 ENCOUNTER — Encounter: Payer: Self-pay | Admitting: Physician Assistant

## 2017-04-08 VITALS — BP 110/70 | HR 76 | Ht 68.0 in | Wt 160.0 lb

## 2017-04-08 DIAGNOSIS — N183 Chronic kidney disease, stage 3 unspecified: Secondary | ICD-10-CM

## 2017-04-08 DIAGNOSIS — E781 Pure hyperglyceridemia: Secondary | ICD-10-CM

## 2017-04-08 LAB — BASIC METABOLIC PANEL WITH GFR
BUN: 21 mg/dL (ref 7–25)
CALCIUM: 9.4 mg/dL (ref 8.6–10.4)
CO2: 25 mmol/L (ref 20–31)
CREATININE: 1.02 mg/dL — AB (ref 0.60–0.93)
Chloride: 107 mmol/L (ref 98–110)
GFR, EST AFRICAN AMERICAN: 63 mL/min (ref >=60)
GFR, EST NON AFRICAN AMERICAN: 55 mL/min — AB (ref >=60)
GLUCOSE: 78 mg/dL (ref 65–99)
Potassium: 4.3 mmol/L (ref 3.5–5.3)
Sodium: 141 mmol/L (ref 135–146)

## 2017-04-08 LAB — LIPID PANEL W/REFLEX DIRECT LDL
Cholesterol: 192 mg/dL (ref <200)
HDL: 75 mg/dL (ref >50)
LDL-CHOLESTEROL: 89 mg/dL
Non-HDL Cholesterol (Calc): 117 mg/dL (ref <130)
Total CHOL/HDL Ratio: 2.6 Ratio (ref <5.0)
Triglycerides: 186 mg/dL — ABNORMAL HIGH (ref <150)

## 2017-04-08 NOTE — Progress Notes (Signed)
   Subjective:    Patient ID: Gabrielle Torres, female    DOB: Dec 20, 1940, 76 y.o.   MRN: [id number]  HPI Pt is a 76 yo female who presents to the clinic for medication refill and follow up. 2 months ago after cmp and repeat lab her Scr was found to be elevated. She is very concerned. She has stopped all NSAIDs and drinking 64 oz of water a day and decreasing salt intake. She is exercising more with Tai chi and feeling really good.    Review of Systems  All other systems reviewed and are negative.      Objective:   Physical Exam  Constitutional: She is oriented to person, place, and time. She appears well-developed and well-nourished.  HENT:  Head: Normocephalic and atraumatic.  Cardiovascular: Normal rate, regular rhythm and normal heart sounds.   Pulmonary/Chest: Effort normal and breath sounds normal.  Neurological: She is alert and oriented to person, place, and time.  Psychiatric: She has a normal mood and affect. Her behavior is normal.          Assessment & Plan:  Marland KitchenMarland KitchenDiagnoses and all orders for this visit:  Hypertriglyceridemia -     Lipid Panel w/reflex Direct LDL  CKD (chronic kidney disease) stage 3, GFR 30-59 ml/min -     BASIC METABOLIC PANEL WITH GFR   Recheck in 3 months BMP if trending down or at baseline. Continue with avoiding NSAIDs, hydration, and limiting salt.   .. Depression screen [facility] 2/9 04/08/2017  Decreased Interest 0  Down, Depressed, Hopeless 1  PHQ - 2 Score 1

## 2017-04-09 NOTE — Progress Notes (Signed)
Call pt: serum creatine stayed the same. GFR the same. Recheck in 3 months.  Cholesterol looks great.  TG went back up a little.

## 2017-06-24 ENCOUNTER — Other Ambulatory Visit: Payer: Self-pay

## 2017-07-22 ENCOUNTER — Telehealth: Payer: Self-pay

## 2017-07-22 ENCOUNTER — Other Ambulatory Visit: Payer: Self-pay | Admitting: Physician Assistant

## 2017-07-22 DIAGNOSIS — R7989 Other specified abnormal findings of blood chemistry: Secondary | ICD-10-CM

## 2017-07-22 NOTE — Telephone Encounter (Signed)
PT called and l/m that she needs to get blood work done but there's not an order in the system. Please call pt to advise when order is ready.

## 2017-07-22 NOTE — Telephone Encounter (Signed)
Lab order placed, left VM for Pt.

## 2017-07-23 DIAGNOSIS — R7989 Other specified abnormal findings of blood chemistry: Secondary | ICD-10-CM | POA: Diagnosis not present

## 2017-07-23 LAB — BASIC METABOLIC PANEL WITH GFR
BUN: 16 mg/dL (ref 7–25)
CO2: 25 mmol/L (ref 20–32)
Calcium: 9.3 mg/dL (ref 8.6–10.4)
Chloride: 105 mmol/L (ref 98–110)
Creat: 0.97 mg/dL — ABNORMAL HIGH (ref 0.60–0.93)
GFR, EST AFRICAN AMERICAN: 67 mL/min (ref >=60)
GFR, EST NON AFRICAN AMERICAN: 58 mL/min — AB (ref >=60)
GLUCOSE: 92 mg/dL (ref 65–99)
POTASSIUM: 4.5 mmol/L (ref 3.5–5.3)
Sodium: 137 mmol/L (ref 135–146)

## 2017-11-24 DIAGNOSIS — S71112A Laceration without foreign body, left thigh, initial encounter: Secondary | ICD-10-CM | POA: Diagnosis not present

## 2017-11-24 DIAGNOSIS — S0101XA Laceration without foreign body of scalp, initial encounter: Secondary | ICD-10-CM | POA: Diagnosis not present

## 2017-11-24 DIAGNOSIS — S098XXA Other specified injuries of head, initial encounter: Secondary | ICD-10-CM | POA: Diagnosis not present

## 2017-11-24 DIAGNOSIS — S0540XA Penetrating wound of orbit with or without foreign body, unspecified eye, initial encounter: Secondary | ICD-10-CM | POA: Diagnosis not present

## 2017-11-24 DIAGNOSIS — S01112A Laceration without foreign body of left eyelid and periocular area, initial encounter: Secondary | ICD-10-CM | POA: Diagnosis not present

## 2017-11-24 DIAGNOSIS — W000XXA Fall on same level due to ice and snow, initial encounter: Secondary | ICD-10-CM | POA: Diagnosis not present

## 2017-11-29 DIAGNOSIS — Z4802 Encounter for removal of sutures: Secondary | ICD-10-CM | POA: Diagnosis not present

## 2017-11-29 DIAGNOSIS — S01112D Laceration without foreign body of left eyelid and periocular area, subsequent encounter: Secondary | ICD-10-CM | POA: Diagnosis not present

## 2017-12-31 ENCOUNTER — Ambulatory Visit (INDEPENDENT_AMBULATORY_CARE_PROVIDER_SITE_OTHER): Payer: Medicare Other | Admitting: Physician Assistant

## 2017-12-31 DIAGNOSIS — Z23 Encounter for immunization: Secondary | ICD-10-CM | POA: Diagnosis not present

## 2017-12-31 NOTE — Progress Notes (Signed)
   Subjective:    Patient ID: Gabrielle Torres, female    DOB: 02/17/1941, 77 y.o.   MRN: [id number]  HPI  [name] is here for a Flu vaccine.   Review of Systems     Objective:   Physical Exam        Assessment & Plan:  Need Flu vaccine - Patient tolerated injection well without complications. Patient advised to schedule next injection 1 year from today.

## 2018-01-14 ENCOUNTER — Ambulatory Visit (INDEPENDENT_AMBULATORY_CARE_PROVIDER_SITE_OTHER): Payer: Medicare Other | Admitting: Physician Assistant

## 2018-01-14 ENCOUNTER — Encounter: Payer: Self-pay | Admitting: Physician Assistant

## 2018-01-14 VITALS — BP 92/66 | HR 79 | Ht 68.0 in | Wt 160.0 lb

## 2018-01-14 DIAGNOSIS — H5789 Other specified disorders of eye and adnexa: Secondary | ICD-10-CM | POA: Diagnosis not present

## 2018-01-14 DIAGNOSIS — Z1239 Encounter for other screening for malignant neoplasm of breast: Secondary | ICD-10-CM

## 2018-01-14 DIAGNOSIS — N183 Chronic kidney disease, stage 3 unspecified: Secondary | ICD-10-CM

## 2018-01-14 DIAGNOSIS — Z1231 Encounter for screening mammogram for malignant neoplasm of breast: Secondary | ICD-10-CM | POA: Diagnosis not present

## 2018-01-14 DIAGNOSIS — H579 Unspecified disorder of eye and adnexa: Secondary | ICD-10-CM | POA: Insufficient documentation

## 2018-01-14 DIAGNOSIS — I950 Idiopathic hypotension: Secondary | ICD-10-CM | POA: Diagnosis not present

## 2018-01-14 NOTE — Progress Notes (Signed)
   Subjective:    Patient ID: Gabrielle Torres, female    DOB: May 23, 1941, 77 y.o.   MRN: [id number]  HPI  Pt is a 77 yo pleasant female who presents to the clinic to follow up after flu shot and symptoms of watery, red, itchy eyes. The morning after the flu shot she had bilateral left watery, itchy, red eyes with left worse than right. Within the last week resolved with anti-histamine eye drops and oral medication. No fever, chills, SoB, or wheezing.   CkD- last checked 6 months ago. Needs labs.   Needs mammogram.   BP low today. Pt reports to be asymptomatic. No dizziness, headaches, vision changes, weakness, sweating.   .. Active Ambulatory Problems    Diagnosis Date Noted  . Vitamin D deficiency 12/28/2015  . Hyperlipidemia 12/28/2015  . Adenomatous polyp of colon 05/07/2016  . Post-menopausal 05/09/2016  . Piriformis syndrome of right side 05/09/2016  . Hypertriglyceridemia 01/09/2017  . CKD (chronic kidney disease) stage 3, GFR 30-59 ml/min (HCC) 02/06/2017  . Idiopathic hypotension 01/14/2018  . Itchy, watery, and red eye 01/14/2018   Resolved Ambulatory Problems    Diagnosis Date Noted  . No Resolved Ambulatory Problems   No Additional Past Medical History      Review of Systems  All other systems reviewed and are negative.      Objective:   Physical Exam  Constitutional: She is oriented to person, place, and time. She appears well-developed and well-nourished.  HENT:  Head: Normocephalic and atraumatic.  Eyes: Conjunctivae and EOM are normal. Pupils are equal, round, and reactive to light. Right eye exhibits no discharge. Left eye exhibits no discharge.  Cardiovascular: Normal rate, regular rhythm and normal heart sounds.  Pulmonary/Chest: Effort normal and breath sounds normal. She has no wheezes.  Lymphadenopathy:    She has no cervical adenopathy.  Neurological: She is alert and oriented to person, place, and time.  Psychiatric: She has a normal mood and  affect. Her behavior is normal.          Assessment & Plan:  .Marland KitchenGeorgenia was seen today for follow-up.  Diagnoses and all orders for this visit:  Itchy, watery, and red eye  CKD (chronic kidney disease) stage 3, GFR 30-59 ml/min (HCC) -     COMPLETE METABOLIC PANEL WITH GFR  Breast cancer screening -     MM SCREENING BREAST TOMO BILATERAL  Idiopathic hypotension   Discussed with patient I don't necessarily think flu shot caused the allergic conjunctivitis symptoms. They have resolved today which is a great thing. Continue to look for allergic triggers. Use antihistamine as needed.   Labs to recheck CKD.   Concerned with low BP. Pt is asymptomatic. Discussed no salt restriction and stay hydrated. Discussed red flags of low BP. Follow up as needed.

## 2018-01-16 ENCOUNTER — Ambulatory Visit (INDEPENDENT_AMBULATORY_CARE_PROVIDER_SITE_OTHER): Payer: Medicare Other

## 2018-01-16 DIAGNOSIS — Z1231 Encounter for screening mammogram for malignant neoplasm of breast: Secondary | ICD-10-CM

## 2018-01-16 DIAGNOSIS — N183 Chronic kidney disease, stage 3 (moderate): Secondary | ICD-10-CM | POA: Diagnosis not present

## 2018-01-16 NOTE — Progress Notes (Signed)
Call pt: normal mammogram. Follow up in one year.

## 2018-01-17 LAB — COMPLETE METABOLIC PANEL WITH GFR
AG Ratio: 1.9 (calc) (ref 1.0–2.5)
ALT: 14 U/L (ref 6–29)
AST: 20 U/L (ref 10–35)
Albumin: 4.5 g/dL (ref 3.6–5.1)
Alkaline phosphatase (APISO): 76 U/L (ref 33–130)
BUN/Creatinine Ratio: 14 (calc) (ref 6–22)
BUN: 16 mg/dL (ref 7–25)
CO2: 25 mmol/L (ref 20–32)
Calcium: 9.7 mg/dL (ref 8.6–10.4)
Chloride: 105 mmol/L (ref 98–110)
Creat: 1.16 mg/dL — ABNORMAL HIGH (ref 0.60–0.93)
GFR, EST AFRICAN AMERICAN: 54 mL/min/{1.73_m2} — AB (ref >=60)
GFR, Est Non African American: 46 mL/min/{1.73_m2} — ABNORMAL LOW (ref >=60)
GLUCOSE: 91 mg/dL (ref 65–99)
Globulin: 2.4 g/dL (calc) (ref 1.9–3.7)
Potassium: 4.4 mmol/L (ref 3.5–5.3)
Sodium: 140 mmol/L (ref 135–146)
TOTAL PROTEIN: 6.9 g/dL (ref 6.1–8.1)
Total Bilirubin: 0.6 mg/dL (ref 0.2–1.2)

## 2018-01-20 NOTE — Progress Notes (Signed)
Call pt: serum creatine did increase a little more and not improve. This is something we watch. I know you are doing everything you can drinking water and avoiding any NSAIDs. Now we just monitor and consider this when prescribing medications. Recheck 3 months.

## 2018-04-03 ENCOUNTER — Encounter: Payer: Self-pay | Admitting: Family Medicine

## 2018-04-03 ENCOUNTER — Ambulatory Visit (INDEPENDENT_AMBULATORY_CARE_PROVIDER_SITE_OTHER): Payer: Medicare Other | Admitting: Family Medicine

## 2018-04-03 ENCOUNTER — Other Ambulatory Visit: Payer: Self-pay | Admitting: *Deleted

## 2018-04-03 VITALS — BP 112/59 | HR 85 | Temp 98.2°F | Ht 68.0 in | Wt 159.0 lb

## 2018-04-03 DIAGNOSIS — R319 Hematuria, unspecified: Secondary | ICD-10-CM

## 2018-04-03 DIAGNOSIS — R300 Dysuria: Secondary | ICD-10-CM | POA: Diagnosis not present

## 2018-04-03 DIAGNOSIS — N3001 Acute cystitis with hematuria: Secondary | ICD-10-CM

## 2018-04-03 DIAGNOSIS — R7989 Other specified abnormal findings of blood chemistry: Secondary | ICD-10-CM

## 2018-04-03 DIAGNOSIS — N183 Chronic kidney disease, stage 3 unspecified: Secondary | ICD-10-CM

## 2018-04-03 LAB — POCT URINALYSIS DIPSTICK
Bilirubin, UA: NEGATIVE
GLUCOSE UA: NEGATIVE
Ketones, UA: NEGATIVE
NITRITE UA: POSITIVE
PH UA: 6 (ref 5.0–8.0)
PROTEIN UA: 100
SPEC GRAV UA: 1.02 (ref 1.010–1.025)
UROBILINOGEN UA: 0.2 U/dL

## 2018-04-03 MED ORDER — SULFAMETHOXAZOLE-TRIMETHOPRIM 800-160 MG PO TABS
1.0000 | ORAL_TABLET | Freq: Two times a day (BID) | ORAL | 0 refills | Status: DC
Start: 2018-04-03 — End: 2018-04-14

## 2018-04-03 NOTE — Patient Instructions (Signed)

## 2018-04-03 NOTE — Progress Notes (Signed)
   Subjective:    Patient ID: Gabrielle Torres, female    DOB: 1941-09-21, 77 y.o.   MRN: [id number]  HPI 77 year old female comes in today for urinary symptoms x 1 days. Started Tuesday night with some urinary frequency but now she is been getting some dysuria.  No significant low back pain.  No fevers chills or sweats.  Does get urinary tract infections occasionally but not frequently.  She denies being on antibiotics recently.  She is been trying purposely to drink about 64 ounces of fluid daily for the last 3 months because of an elevated creatinine.     Review of Systems     Objective:   Physical Exam  Constitutional: She is oriented to person, place, and time. She appears well-developed and well-nourished.  HENT:  Head: Normocephalic and atraumatic.  Cardiovascular: Normal rate, regular rhythm and normal heart sounds.  Pulmonary/Chest: Effort normal and breath sounds normal.  Abdominal: Soft. Bowel sounds are normal.  Musculoskeletal:  No CVA tenderness  Neurological: She is alert and oriented to person, place, and time.  Skin: Skin is warm and dry.  Psychiatric: She has a normal mood and affect. Her behavior is normal.       Assessment & Plan:  UTI -urinalysis positive so we will go ahead and treat with Bactrim for 3 days.  Continue to drink 64 ounces of fluid daily.  I do want her to recheck a UA in about 2 weeks just to make sure that the blood has cleared.  It is likely from infection but I want to make sure it has resolved especially with recent bump in her creatinine.  I believe she is due to recheck her creatinine this month as well so she can time those 2 at the same time.

## 2018-04-14 ENCOUNTER — Ambulatory Visit: Payer: Medicare Other

## 2018-04-14 ENCOUNTER — Encounter: Payer: Self-pay | Admitting: Physician Assistant

## 2018-04-14 ENCOUNTER — Ambulatory Visit (INDEPENDENT_AMBULATORY_CARE_PROVIDER_SITE_OTHER): Payer: Medicare Other | Admitting: Physician Assistant

## 2018-04-14 VITALS — BP 101/58 | HR 74 | Ht 67.99 in | Wt 159.0 lb

## 2018-04-14 DIAGNOSIS — Z79899 Other long term (current) drug therapy: Secondary | ICD-10-CM

## 2018-04-14 DIAGNOSIS — R2231 Localized swelling, mass and lump, right upper limb: Secondary | ICD-10-CM

## 2018-04-14 DIAGNOSIS — N183 Chronic kidney disease, stage 3 unspecified: Secondary | ICD-10-CM

## 2018-04-14 LAB — BASIC METABOLIC PANEL WITH GFR
BUN: 10 mg/dL (ref 7–25)
CHLORIDE: 105 mmol/L (ref 98–110)
CO2: 27 mmol/L (ref 20–32)
CREATININE: 0.92 mg/dL (ref 0.60–0.93)
Calcium: 9.4 mg/dL (ref 8.6–10.4)
GFR, Est African American: 71 mL/min/{1.73_m2} (ref >=60)
GFR, Est Non African American: 61 mL/min/{1.73_m2} (ref >=60)
GLUCOSE: 82 mg/dL (ref 65–99)
POTASSIUM: 4.3 mmol/L (ref 3.5–5.3)
Sodium: 140 mmol/L (ref 135–146)

## 2018-04-14 MED ORDER — DICLOFENAC SODIUM 1 % TD GEL: 4.0000 g | Freq: Four times a day (QID) | TRANSDERMAL | 1 refills | Status: DC

## 2018-04-14 NOTE — Progress Notes (Signed)
   Subjective:    Patient ID: Gabrielle Torres, female    DOB: Oct 30, 1941, 77 y.o.   MRN: [id number]  HPI  Pt is a 77 year old pleasant female who presents for evaluation of a bump on her right wrist. She first noticed it about 5 days ago due to numbness over the thenar eminence of her palm. She recently started coloring at night for several hours(3-4) and noticed that the pain started after that. The bump is slightly tender, and 2 days ago she noticed a shooting pain from the web space between her thumb and index finger towards her wrist when she applied downward pressure on her wrist. She denies any injury to the area. She has been icing the area with some relief.  .. Active Ambulatory Problems    Diagnosis Date Noted  . Vitamin D deficiency 12/28/2015  . Hyperlipidemia 12/28/2015  . Adenomatous polyp of colon 05/07/2016  . Post-menopausal 05/09/2016  . Piriformis syndrome of right side 05/09/2016  . Hypertriglyceridemia 01/09/2017  . CKD (chronic kidney disease) stage 3, GFR 30-59 ml/min (HCC) 02/06/2017  . Idiopathic hypotension 01/14/2018  . Itchy, watery, and red eye 01/14/2018   Resolved Ambulatory Problems    Diagnosis Date Noted  . No Resolved Ambulatory Problems   No Additional Past Medical History     Review of Systems  All other systems reviewed and are negative.      Objective:   Physical Exam  Constitutional: She is oriented to person, place, and time. She appears well-developed and well-nourished.  HENT:  Head: Normocephalic and atraumatic.  Cardiovascular: Normal rate and regular rhythm.  Neurological: She is alert and oriented to person, place, and time.  Skin: Skin is warm and dry.  Musculoskeletal: Wrist active and passive ROM intact bilaterally.Good strength and tone of the left wrist and forearm. +3/5 strength of right wrist with resisted extension, +5/5 strength with resisted flexion. +5/5 grip strength bilaterally. Negative tinel sign over the right wrist.  There is mild tenderness with complete flexion and extension of the right wrist. 1x1 cm palpable nodule on the radial side of the right wrist.     Assessment & Plan:  .Gabrielle Torres was seen today for hand pain.  Diagnoses and all orders for this visit:  Mass of right wrist -     BASIC METABOLIC PANEL WITH GFR  Other orders -     diclofenac sodium (VOLTAREN) 1 % GEL; Apply 4 g topically 4 (four) times daily. To affected joint.   Nodule likely a ganglion cyst. Applied ace bandage to area for compression. Advised patient to wear this on the right wrist all day and night for mild compression over the area of the cyst. Encouraged regular icing.   Prescribed voltaren gel for application over the area of the cyst up to 4x/day since she has CKD and should avoid OTC NSAiDs. Tylenol for pain.  Advised that if the nodule has not decreased in size and/or if symptoms of numbness and pain have not improved or resolved in the next 1-2 weeks to schedule an appointment with sports medicine for ultrasound and possible injection to the area.

## 2018-04-14 NOTE — Patient Instructions (Signed)
Ganglion Cyst A ganglion cyst is a noncancerous, fluid-filled lump that occurs near joints or tendons. The ganglion cyst grows out of a joint or the lining of a tendon. It most often develops in the hand or wrist, but it can also develop in the shoulder, elbow, hip, knee, ankle, or foot. The round or oval ganglion cyst can be the size of a pea or larger than a grape. Increased activity may enlarge the size of the cyst because more fluid starts to build up. What are the causes? It is not known what causes a ganglion cyst to grow. However, it may be related to:  Inflammation or irritation around the joint.  An injury.  Repetitive movements or overuse.  Arthritis.  What increases the risk? Risk factors include:  Being a woman.  Being age 20-50.  What are the signs or symptoms? Symptoms may include:  A lump. This most often appears on the hand or wrist, but it can occur in other areas of the body.  Tingling.  Pain.  Numbness.  Muscle weakness.  Weak grip.  Less movement in a joint.  How is this diagnosed? Ganglion cysts are most often diagnosed based on a physical exam. Your health care provider will feel the lump and may shine a light alongside it. If it is a ganglion cyst, a light often shines through it. Your health care provider may order an X-ray, ultrasound, or MRI to rule out other conditions. How is this treated? Ganglion cysts usually go away on their own without treatment. If pain or other symptoms are involved, treatment may be needed. Treatment is also needed if the ganglion cyst limits your movement or if it gets infected. Treatment may include:  Wearing a brace or splint on your wrist or finger.  Taking anti-inflammatory medicine.  Draining fluid from the lump with a needle (aspiration).  Injecting a steroid into the joint.  Surgery to remove the ganglion cyst.  Follow these instructions at home:  Do not press on the ganglion cyst, poke it with a  needle, or hit it.  Take medicines only as directed by your health care provider.  Wear your brace or splint as directed by your health care provider.  Watch your ganglion cyst for any changes.  Keep all follow-up visits as directed by your health care provider. This is important. Contact a health care provider if:  Your ganglion cyst becomes larger or more painful.  You have increased redness, red streaks, or swelling.  You have pus coming from the lump.  You have weakness or numbness in the affected area.  You have a fever or chills. This information is not intended to replace advice given to you by your health care provider. Make sure you discuss any questions you have with your health care provider. Document Released: 11/16/2000 Document Revised: 04/26/2016 Document Reviewed: 05/04/2014 Elsevier Interactive Patient Education  2018 Elsevier Inc.  

## 2018-04-15 ENCOUNTER — Encounter: Payer: Self-pay | Admitting: Physician Assistant

## 2018-04-15 DIAGNOSIS — R2231 Localized swelling, mass and lump, right upper limb: Secondary | ICD-10-CM | POA: Insufficient documentation

## 2018-04-15 NOTE — Progress Notes (Signed)
Call pt: labs look great. Kidney function improved a lot!

## 2018-05-23 ENCOUNTER — Other Ambulatory Visit: Payer: Self-pay | Admitting: Physician Assistant

## 2018-05-23 MED ORDER — PRAVASTATIN SODIUM 40 MG PO TABS: 40.0000 mg | ORAL_TABLET | Freq: Every day | ORAL | 4 refills | Status: DC

## 2018-05-23 NOTE — Telephone Encounter (Signed)
Patient calls for refill of cholesterol med. Sent med to mail order phamacy. KG LPN

## 2018-07-14 ENCOUNTER — Encounter: Payer: Self-pay | Admitting: Physician Assistant

## 2018-07-14 ENCOUNTER — Ambulatory Visit (INDEPENDENT_AMBULATORY_CARE_PROVIDER_SITE_OTHER): Payer: Medicare Other | Admitting: Physician Assistant

## 2018-07-14 VITALS — BP 112/73 | HR 66 | Ht 67.99 in | Wt 162.0 lb

## 2018-07-14 DIAGNOSIS — R2231 Localized swelling, mass and lump, right upper limb: Secondary | ICD-10-CM | POA: Diagnosis not present

## 2018-07-14 DIAGNOSIS — E785 Hyperlipidemia, unspecified: Secondary | ICD-10-CM

## 2018-07-14 DIAGNOSIS — E781 Pure hyperglyceridemia: Secondary | ICD-10-CM

## 2018-07-14 DIAGNOSIS — R748 Abnormal levels of other serum enzymes: Secondary | ICD-10-CM | POA: Diagnosis not present

## 2018-07-14 NOTE — Patient Instructions (Signed)
Food Basics for Chronic Kidney Disease When your kidneys are not working well, they cannot remove waste and excess substances from your blood as effectively as they did before. This can lead to a buildup and imbalance of these substances, which can worsen kidney damage and affect how your body functions. Certain foods lead to a buildup of these substances in the body. By changing your diet as recommended by your diet and nutrition specialist (dietitian) or health care provider, you could help prevent further kidney damage and delay or prevent the need for dialysis. What are tips for following this plan? General instructions  Work with your health care provider and dietitian to develop a meal plan that is right for you. Foods you can eat, limit, or avoid will be different for each person depending on the stage of kidney disease and any other existing health conditions.  Talk with your health care provider about whether you should take a vitamin and mineral supplement.  Use standard measuring cups and spoons to measure servings of foods. Use a kitchen scale to measure portions of protein foods.  If directed by your health care provider, avoid drinking too much fluid. Measure and count all liquids, including water, ice, soups, flavored gelatin, and frozen desserts such as popsicles or ice cream. Reading food labels  Check the amount of sodium in foods. Choose foods that have less than 300 milligrams (mg) per serving.  Check the ingredient list for phosphorus or potassium-based additives or preservatives.  Check the amount of saturated and trans fat. Limit or avoid these fats as told by your dietitian. Shopping  Avoid buying foods that are: ? Processed, frozen, or prepackaged. ? Calcium-enriched or fortified.  Do not buy foods that have salt or sodium listed among the first five ingredients.  Do not buy canned vegetables. Cooking  Replace animal proteins, such as meat, fish, eggs, or dairy,  with plant proteins from beans, nuts, and soy. ? Use soy milk instead of cow's milk. ? Add beans or tofu to soups, casseroles, or pasta dishes instead of meat.  Soak vegetables, such as potatoes, before cooking to reduce potassium. To do this: ? Peel and cut into small pieces. ? Soak in warm water for at least 2 hours. For every 1 cup of vegetables, use 10 cups of water. ? Drain and rinse with warm water. ? Boil for at least 5 minutes. Meal planning  Limit the amount of protein from plant and animal sources you eat each day.  Do not add salt to food when cooking or before eating.  Eat meals and snacks at around the same time each day. If you have diabetes:  If you have diabetes (diabetes mellitus) and chronic kidney disease, it is important to keep your blood glucose in the target range recommended by your health care provider. Follow your diabetes management plan. This may include: ? Checking your blood glucose regularly. ? Taking oral medicines, insulin, or both. ? Exercising for at least 30 minutes on 5 or more days each week, or as told by your health care provider. ? Tracking how many servings of carbohydrates you eat at each meal.  You may be given specific guidelines on how much of certain foods and nutrients you may eat, depending on your stage of kidney disease and whether you have high blood pressure (hypertension). Follow your meal plan as told by your dietitian. What nutrients should be limited? The items listed are not a complete list. Talk with your dietitian about  what dietary choices are best for you. Potassium Potassium affects how steadily your heart beats. If too much potassium builds up in your blood, it can cause an irregular heartbeat or even a heart attack. You may need to eat less potassium, depending on your blood potassium levels and the stage of kidney disease. Talk to your dietitian about how much potassium you may have each day. You may need to limit or  avoid foods that are high in potassium, such as:  Milk and soy milk.  Fruits, such as bananas, papaya, apricots, nectarines, melon, prunes, raisins, kiwi, and oranges.  Vegetables, such as potatoes, sweet potatoes, yams, tomatoes, leafy greens, beets, okra, avocado, pumpkin, and winter squash.  White and lima beans.  Phosphorus Phosphorus is a mineral found in your bones. A balance between calcium and phosphorous is needed to build and maintain healthy bones. Too much phosphorus pulls calcium from your bones. This can make your bones weak and more likely to break. Too much phosphorus can also make your skin itch. You may need to eat less phosphorus depending on your blood phosphorus levels and the stage of kidney disease. Talk to your dietitian about how much potassium you may have each day. You may need to take medicine to lower your blood phosphorus levels if diet changes do not help. You may need to limit or avoid foods that are high in phosphorus, such as:  Milk and dairy products.  Dried beans and peas.  Tofu, soy milk, and other soy-based meat replacements.  Colas.  Nuts and peanut butter.  Meat, poultry, and fish.  Bran cereals and oatmeals.  Protein Protein helps you to make and keep muscle. It also helps in the repair of your body's cells and tissues. One of the natural breakdown products of protein is a waste product called urea. When your kidneys are not working properly, they cannot remove wastes, such as urea, like they did before you developed chronic kidney disease. Reducing how much protein you eat can help prevent a buildup of urea in your blood. Depending on your stage of kidney disease, you may need to limit foods that are high in protein. Sources of animal protein include:  Meat (all types).  Fish and seafood.  Poultry.  Eggs.  Dairy.  Other protein foods include:  Beans and legumes.  Nuts and nut butter.  Soy and tofu.  Sodium Sodium, which is  found in salt, helps maintain a healthy balance of fluids in your body. Too much sodium can increase your blood pressure and have a negative effect on the function of your heart and lungs. Too much sodium can also cause your body to retain too much fluid, making your kidneys work harder. Most people should have less than 2,300 milligrams (mg) of sodium each day. If you have hypertension, you may need to limit your sodium to 1,500 mg each day. Talk to your dietitian about how much sodium you may have each day. You may need to limit or avoid foods that are high in sodium, such as:  Salt seasonings.  Soy sauce.  Cured and processed meats.  Salted crackers and snack foods.  Fast food.  Canned soups and most canned foods.  Pickled foods.  Vegetable juice.  Boxed mixes or ready-to-eat boxed meals and side dishes.  Bottled dressings, sauces, and marinades.  Summary  Chronic kidney disease can lead to a buildup and imbalance of waste and excess substances in the body. Certain foods lead to a buildup of  as:  · Salt seasonings.  · Soy sauce.  · Cured and processed meats.  · Salted crackers and snack foods.  · Fast food.  · Canned soups and most canned foods.  · Pickled foods.  · Vegetable juice.  · Boxed mixes or ready-to-eat boxed meals and side dishes.  · Bottled dressings, sauces, and marinades.    Summary  · Chronic kidney disease can lead to a buildup and imbalance of waste and excess substances in the body. Certain foods lead to a buildup of these substances. By adjusting your intake of these foods, you could help prevent more kidney damage and delay or prevent the need for dialysis.  · Food adjustments are different for each person with chronic kidney disease. Work with a dietitian to set up nutrient goals and a meal plan that is right for you.  · If you have diabetes and chronic kidney disease, it is important to keep your blood glucose in the target range recommended by your health care provider.  This information is not intended to replace advice given to you by your health care provider. Make sure you discuss any questions you have with your health care provider.  Document Released: 02/09/2003 Document Revised: 11/14/2016 Document Reviewed: 11/14/2016  Elsevier Interactive Patient Education © 2018 Elsevier Inc.

## 2018-07-14 NOTE — Progress Notes (Signed)
   Subjective:    Patient ID: Gabrielle Torres, female    DOB: 02-06-1941, 77 y.o.   MRN: [id number]  HPI Patient is a 77 year old very pleasant female who presents to the clinic for 106-month follow-up.  She was seen 3 months ago for ganglion cyst of her right wrist.  That has completely resolved.  She has no pain.  We rechecked her BMP for her kidney function which showed no chronic kidney disease.  This is also resolved.  Patient feels great and has no concerns or complaints.  She does wonder what foods she can eat to keep her kidney function staying in the normal range.  .. Active Ambulatory Problems    Diagnosis Date Noted  . Vitamin D deficiency 12/28/2015  . Hyperlipidemia 12/28/2015  . Adenomatous polyp of colon 05/07/2016  . Post-menopausal 05/09/2016  . Piriformis syndrome of right side 05/09/2016  . Hypertriglyceridemia 01/09/2017  . Idiopathic hypotension 01/14/2018  . Itchy, watery, and red eye 01/14/2018  . Mass of right wrist 04/15/2018   Resolved Ambulatory Problems    Diagnosis Date Noted  . CKD (chronic kidney disease) stage 3, GFR 30-59 ml/min (HCC) 02/06/2017   No Additional Past Medical History      Review of Systems  All other systems reviewed and are negative.      Objective:   Physical Exam  Constitutional: She is oriented to person, place, and time. She appears well-developed and well-nourished.  HENT:  Head: Normocephalic and atraumatic.  Cardiovascular: Normal rate and regular rhythm.  Pulmonary/Chest: Effort normal and breath sounds normal.  Musculoskeletal:  No masses of extremity.   Neurological: She is alert and oriented to person, place, and time.  Psychiatric: She has a normal mood and affect. Her behavior is normal.          Assessment & Plan:  Marland KitchenMarland KitchenDiagnoses and all orders for this visit:  Mass of right wrist  Hypertriglyceridemia  Hyperlipidemia, unspecified hyperlipidemia type  Elevated creatine kinase   Right wrist mass  resolved.  Creatine normal and has resolved.  Cholesterol checked and looked good a little over a year but will hold off and recheck at 2 year mark.  Medications refilled.  Recheck SCr in 6 months. Discussed proper hydration, avoid NSAIDs to keep kidney function normal.

## 2018-10-22 ENCOUNTER — Other Ambulatory Visit: Payer: Self-pay

## 2018-11-10 NOTE — Progress Notes (Signed)
Subjective:   Gabrielle Torres is a 77 y.o. female who presents for an Initial Medicare Annual Wellness Visit.  Review of Systems    No ROS.  Medicare Wellness Visit. Additional risk factors are reflected in the social history.   Cardiac Risk Factors include: dyslipidemia;advanced age (>65men, >57 women) Sleep patterns:  Getting about 9 hours of sleep a night. Feels rested when wakes up. Wakes up 4 times a night to use the bathroom. Drinks 64 ounces of water a day so contribute it to that  Home Safety/Smoke Alarms: Feels safe in home. Smoke alarms in place.  Living environment; Lives alone in 1 story home. No steps. Shower is a step over shower and does have a mat in place in the tub. Daughter moving in with her in 2020. Seat Belt Safety/Bike Helmet: Wears seat belt.   Female:   Pap- aged out unless necessary      Mammo-  utd     Dexa scan-  utd      CCS- utd     Objective:    There were no vitals filed for this visit. There is no height or weight on file to calculate BMI.  Advanced Directives 11/17/2018 12/28/2015  Does Patient Have a Medical Advance Directive? No No  Would patient like information on creating a medical advance directive? No - Patient declined Yes - Educational materials given    Current Medications (verified) Outpatient Encounter Medications as of 11/17/2018  Medication Sig  . acetaminophen (TYLENOL) 325 MG tablet Take 650 mg by mouth as needed.  Marland Kitchen aspirin 81 MG tablet Take 81 mg by mouth daily.  . calcium-vitamin D (OSCAL WITH D) 500-200 MG-UNIT tablet Take 1 tablet by mouth.  . diclofenac sodium (VOLTAREN) 1 % GEL Apply 4 g topically 4 (four) times daily. To affected joint.  . Omega-3 Fatty Acids (FISH OIL PO) Take 3,000 mg by mouth daily.  . pravastatin (PRAVACHOL) 40 MG tablet Take 1 tablet (40 mg total) by mouth daily.  . AMBULATORY NON FORMULARY MEDICATION zostavax IM once for shingles prevention. (Patient not taking: Reported on 11/17/2018)  . CRANBERRY  EXTRACT PO Take by mouth.  Gabrielle Torres Calcium (STOOL SOFTENER PO) Take by mouth.   No facility-administered encounter medications on file as of 11/17/2018.     Allergies (verified) Patient has no known allergies.   History: History reviewed. No pertinent past medical history. Past Surgical History:  Procedure Laterality Date  . CHOLECYSTECTOMY     Family History  Problem Relation Age of Onset  . Depression Father   . Suicidality Father   . Breast cancer Mother 92  . Hypertension Son   . Hypertension Paternal Aunt    Social History   Socioeconomic History  . Marital status: Widowed    Spouse name: Not on file  . Number of children: 3  . Years of education: 25  . Highest education level: 12th grade  Occupational History  . Occupation: retired    Comment: housekeeper  Social Needs  . Financial resource strain: Not hard at all  . Food insecurity:    Worry: Never true    Inability: Never true  . Transportation needs:    Medical: No    Non-medical: No  Tobacco Use  . Smoking status: Never Smoker  . Smokeless tobacco: Never Used  Substance and Sexual Activity  . Alcohol use: No    Alcohol/week: 0.0 standard drinks    Comment: Occasional  . Drug use: No  .  Sexual activity: Never    Birth control/protection: None, Post-menopausal  Lifestyle  . Physical activity:    Days per week: 2 days    Minutes per session: 60 min  . Stress: Not at all  Relationships  . Social connections:    Talks on phone: More than three times a week    Gets together: Three times a week    Attends religious service: Never    Active member of club or organization: No    Attends meetings of clubs or organizations: Never    Relationship status: Widowed  Other Topics Concern  . Not on file  Social History Narrative   Does [place] [employer] 2 times a week. Plays cards with friends during the week. Helps take  Care of daughters horse 2 times a week. Very active. Does a lot of games, reading. Caffeine  minimal use    Tobacco Counseling Counseling given: Not Answered   Clinical Intake:  Pre-visit preparation completed: Yes  Pain : No/denies pain     Nutritional Risks: None Diabetes: No  How often do you need to have someone help you when you read instructions, pamphlets, or other written materials from your doctor or pharmacy?: 1 - Never What is the last grade level you completed in school?: 12  Interpreter Needed?: No  Information entered by :: Joretta Bachelor, LPN   Activities of Daily Living In your present state of health, do you have any difficulty performing the following activities: 11/17/2018  Hearing? Y  Comment only in crowds but does well one on one  Vision? N  Difficulty concentrating or making decisions? N  Walking or climbing stairs? N  Dressing or bathing? N  Doing errands, shopping? N  Preparing Food and eating ? N  Using the Toilet? N  In the past six months, have you accidently leaked urine? N  Do you have problems with loss of bowel control? N  Managing your Medications? N  Managing your Finances? N  Housekeeping or managing your Housekeeping? N  Some recent data might be hidden     Immunizations and Health Maintenance Immunization History  Administered Date(s) Administered  . Influenza, High Dose Seasonal PF 12/31/2017  . Influenza,inj,Quad PF,6+ Mos 01/09/2017  . Pneumococcal Conjugate-13 12/28/2015  . Pneumococcal Polysaccharide-23 01/09/2017  . Tdap 12/28/2015   Health Maintenance Due  Topic Date Due  . INFLUENZA VACCINE  07/03/2018    Patient Care Team: Lavada Mesi as PCP - General (Family Medicine)  Indicate any recent Medical Services you may have received from other than [facility] providers in the past year (date may be approximate).     Assessment:   This is a routine wellness examination for Gabrielle Torres.Physical assessment deferred to PCP.   Hearing/Vision screen  Visual Acuity Screening   Right eye Left eye Both eyes   Without correction:     With correction: 20/30 20/30 20/25   Hearing Screening Comments: Whisper test done and pt reports back all three words correctly  Dietary issues and exercise activities discussed: Current Exercise Habits: Structured exercise class, Type of exercise: walking;Other - see comments(thai chi), Time (Minutes): 60, Frequency (Times/Week): 2, Weekly Exercise (Minutes/Week): 120, Intensity: Mild, Exercise limited by: None identified Diet  No bananas or potatoes, Eats a lot of chicken and rice. Apples, blueberries and oranges. Eating greens. Drinks 2% milk does drink a lot of water daily. Breakfast: cereal with blueberries Lunch:  Soup with rice in it and cabbage or chicken sandwich Dinner:  Eats out  sometimes other wise eats healthy foods.      Goals   None    Depression Screen PHQ 2/9 Scores 11/17/2018 07/14/2018 01/14/2018 04/08/2017  PHQ - 2 Score 0 0 0 1  PHQ- 9 Score - 0 0 -    Fall Risk Fall Risk  11/17/2018 06/24/2017  Falls in the past year? 0 No  Comment - Emmi Telephone Survey: data to providers prior to load    Is the patient's home free of loose throw rugs in walkways, pet beds, electrical cords, etc?   yes      Grab bars in the bathroom? no      Handrails on the stairs?   no      Adequate lighting?   yes  Cognitive Function:     6CIT Screen 11/17/2018  What Year? 0 points  What month? 0 points  What time? 0 points  Count back from 20 0 points  Months in reverse 2 points  Repeat phrase 0 points  Total Score 2    Screening Tests Health Maintenance  Topic Date Due  . INFLUENZA VACCINE  07/03/2018  . COLONOSCOPY  02/02/2019  . TETANUS/TDAP  12/27/2025  . DEXA SCAN  Completed  . PNA vac Low Risk Adult  Completed      Plan:      Gabrielle Torres , Thank you for taking time to come for your Medicare Wellness Visit. I appreciate your ongoing commitment to your health goals. Please review the following plan we discussed and let me know if I can  assist you in the future.  Please schedule your next medicare wellness visit with me in 1 yr. Continue doing brain stimulating activities (puzzles, reading, adult coloring books, staying active) to keep memory sharp.    These are the goals we discussed: Goals   None     This is a list of the screening recommended for you and due dates:  Health Maintenance  Topic Date Due  . Flu Shot  07/03/2018  . Colon Cancer Screening  02/02/2019  . Tetanus Vaccine  12/27/2025  . DEXA scan (bone density measurement)  Completed  . Pneumonia vaccines  Completed     I have personally reviewed and noted the following in the patient's chart:   . Medical and social history . Use of alcohol, tobacco or illicit drugs  . Current medications and supplements . Functional ability and status . Nutritional status . Physical activity . Advanced directives . List of other physicians . Hospitalizations, surgeries, and ER visits in previous 12 months . Vitals . Screenings to include cognitive, depression, and falls . Referrals and appointments  In addition, I have reviewed and discussed with patient certain preventive protocols, quality metrics, and best practice recommendations. A written personalized care plan for preventive services as well as general preventive health recommendations were provided to patient.     Joanne Chars, LPN   16/09/9603

## 2018-11-17 ENCOUNTER — Ambulatory Visit (INDEPENDENT_AMBULATORY_CARE_PROVIDER_SITE_OTHER): Payer: Medicare Other | Admitting: *Deleted

## 2018-11-17 VITALS — BP 97/53 | HR 63 | Ht 68.0 in | Wt 166.0 lb

## 2018-11-17 DIAGNOSIS — Z0000 Encounter for general adult medical examination without abnormal findings: Secondary | ICD-10-CM | POA: Diagnosis not present

## 2018-11-17 DIAGNOSIS — Z23 Encounter for immunization: Secondary | ICD-10-CM

## 2018-11-17 NOTE — Patient Instructions (Signed)
Gabrielle Torres , Thank you for taking time to come for your Medicare Wellness Visit. I appreciate your ongoing commitment to your health goals. Please review the following plan we discussed and let me know if I can assist you in the future.  Please schedule your next medicare wellness visit with me in 1 yr. Continue doing brain stimulating activities (puzzles, reading, adult coloring books, staying active) to keep memory sharp.  Does not have chronic kidney disease but is watching foods that are not good for kidneys.  You are doing a great job with your health. Continue the good work!! Warehouse manager for Chronic Kidney Disease When your kidneys are not working well, they cannot remove waste and excess substances from your blood as effectively as they did before. This can lead to a buildup and imbalance of these substances, which can worsen kidney damage and affect how your body functions. Certain foods lead to a buildup of these substances in the body. By changing your diet as recommended by your diet and nutrition specialist (dietitian) or health care provider, you could help prevent further kidney damage and delay or prevent the need for dialysis. What are tips for following this plan? General instructions  Work with your health care provider and dietitian to develop a meal plan that is right for you. Foods you can eat, limit, or avoid will be different for each person depending on the stage of kidney disease and any other existing health conditions.  Talk with your health care provider about whether you should take a vitamin and mineral supplement.  Use standard measuring cups and spoons to measure servings of foods. Use a kitchen scale to measure portions of protein foods.  If directed by your health care provider, avoid drinking too much fluid. Measure and count all liquids, including water, ice, soups, flavored gelatin, and frozen desserts such as popsicles or ice cream. Reading food labels  Check  the amount of sodium in foods. Choose foods that have less than 300 milligrams (mg) per serving.  Check the ingredient list for phosphorus or potassium-based additives or preservatives.  Check the amount of saturated and trans fat. Limit or avoid these fats as told by your dietitian. Shopping  Avoid buying foods that are: ? Processed, frozen, or prepackaged. ? Calcium-enriched or fortified.  Do not buy foods that have salt or sodium listed among the first five ingredients.  Do not buy canned vegetables. Cooking  Replace animal proteins, such as meat, fish, eggs, or dairy, with plant proteins from beans, nuts, and soy. ? Use soy milk instead of cow's milk. ? Add beans or tofu to soups, casseroles, or pasta dishes instead of meat.  Soak vegetables, such as potatoes, before cooking to reduce potassium. To do this: ? Peel and cut into small pieces. ? Soak in warm water for at least 2 hours. For every 1 cup of vegetables, use 10 cups of water. ? Drain and rinse with warm water. ? Boil for at least 5 minutes. Meal planning  Limit the amount of protein from plant and animal sources you eat each day.  Do not add salt to food when cooking or before eating.  Eat meals and snacks at around the same time each day. If you have diabetes:  If you have diabetes (diabetes mellitus) and chronic kidney disease, it is important to keep your blood glucose in the target range recommended by your health care provider. Follow your diabetes management plan. This may include: ? Checking your blood  glucose regularly. ? Taking oral medicines, insulin, or both. ? Exercising for at least 30 minutes on 5 or more days each week, or as told by your health care provider. ? Tracking how many servings of carbohydrates you eat at each meal.  You may be given specific guidelines on how much of certain foods and nutrients you may eat, depending on your stage of kidney disease and whether you have high blood  pressure (hypertension). Follow your meal plan as told by your dietitian. What nutrients should be limited? The items listed are not a complete list. Talk with your dietitian about what dietary choices are best for you. Potassium Potassium affects how steadily your heart beats. If too much potassium builds up in your blood, it can cause an irregular heartbeat or even a heart attack. You may need to eat less potassium, depending on your blood potassium levels and the stage of kidney disease. Talk to your dietitian about how much potassium you may have each day. You may need to limit or avoid foods that are high in potassium, such as:  Milk and soy milk.  Fruits, such as bananas, papaya, apricots, nectarines, melon, prunes, raisins, kiwi, and oranges.  Vegetables, such as potatoes, sweet potatoes, yams, tomatoes, leafy greens, beets, okra, avocado, pumpkin, and winter squash.  White and lima beans.  Phosphorus Phosphorus is a mineral found in your bones. A balance between calcium and phosphorous is needed to build and maintain healthy bones. Too much phosphorus pulls calcium from your bones. This can make your bones weak and more likely to break. Too much phosphorus can also make your skin itch. You may need to eat less phosphorus depending on your blood phosphorus levels and the stage of kidney disease. Talk to your dietitian about how much potassium you may have each day. You may need to take medicine to lower your blood phosphorus levels if diet changes do not help. You may need to limit or avoid foods that are high in phosphorus, such as:  Milk and dairy products.  Dried beans and peas.  Tofu, soy milk, and other soy-based meat replacements.  Colas.  Nuts and peanut butter.  Meat, poultry, and fish.  Bran cereals and oatmeals.  Protein Protein helps you to make and keep muscle. It also helps in the repair of your body's cells and tissues. One of the natural breakdown products of  protein is a waste product called urea. When your kidneys are not working properly, they cannot remove wastes, such as urea, like they did before you developed chronic kidney disease. Reducing how much protein you eat can help prevent a buildup of urea in your blood. Depending on your stage of kidney disease, you may need to limit foods that are high in protein. Sources of animal protein include:  Meat (all types).  Fish and seafood.  Poultry.  Eggs.  Dairy.  Other protein foods include:  Beans and legumes.  Nuts and nut butter.  Soy and tofu.  Sodium Sodium, which is found in salt, helps maintain a healthy balance of fluids in your body. Too much sodium can increase your blood pressure and have a negative effect on the function of your heart and lungs. Too much sodium can also cause your body to retain too much fluid, making your kidneys work harder. Most people should have less than 2,300 milligrams (mg) of sodium each day. If you have hypertension, you may need to limit your sodium to 1,500 mg each day. Talk to  your dietitian about how much sodium you may have each day. You may need to limit or avoid foods that are high in sodium, such as:  Salt seasonings.  Soy sauce.  Cured and processed meats.  Salted crackers and snack foods.  Fast food.  Canned soups and most canned foods.  Pickled foods.  Vegetable juice.  Boxed mixes or ready-to-eat boxed meals and side dishes.  Bottled dressings, sauces, and marinades.  Summary  Chronic kidney disease can lead to a buildup and imbalance of waste and excess substances in the body. Certain foods lead to a buildup of these substances. By adjusting your intake of these foods, you could help prevent more kidney damage and delay or prevent the need for dialysis.  Food adjustments are different for each person with chronic kidney disease. Work with a dietitian to set up nutrient goals and a meal plan that is right for you.  If  you have diabetes and chronic kidney disease, it is important to keep your blood glucose in the target range recommended by your health care provider. This information is not intended to replace advice given to you by your health care provider. Make sure you discuss any questions you have with your health care provider. Document Released: 02/09/2003 Document Revised: 11/14/2016 Document Reviewed: 11/14/2016 Elsevier Interactive Patient Education  [employer].

## 2018-12-10 ENCOUNTER — Emergency Department (INDEPENDENT_AMBULATORY_CARE_PROVIDER_SITE_OTHER)
Admission: EM | Admit: 2018-12-10 | Discharge: 2018-12-10 | Disposition: A | Discharge status: Home / Self Care | Payer: Medicare Other | Source: Home / Self Care | Attending: Family Medicine | Admitting: Family Medicine

## 2018-12-10 ENCOUNTER — Emergency Department (INDEPENDENT_AMBULATORY_CARE_PROVIDER_SITE_OTHER): Payer: Medicare Other

## 2018-12-10 ENCOUNTER — Other Ambulatory Visit: Payer: Self-pay

## 2018-12-10 ENCOUNTER — Encounter: Payer: Self-pay | Admitting: *Deleted

## 2018-12-10 DIAGNOSIS — J181 Lobar pneumonia, unspecified organism: Secondary | ICD-10-CM

## 2018-12-10 DIAGNOSIS — R42 Dizziness and giddiness: Secondary | ICD-10-CM

## 2018-12-10 DIAGNOSIS — R918 Other nonspecific abnormal finding of lung field: Secondary | ICD-10-CM | POA: Diagnosis not present

## 2018-12-10 DIAGNOSIS — R0602 Shortness of breath: Secondary | ICD-10-CM | POA: Diagnosis not present

## 2018-12-10 DIAGNOSIS — J189 Pneumonia, unspecified organism: Secondary | ICD-10-CM

## 2018-12-10 LAB — POCT CBC W AUTO DIFF (K'VILLE URGENT CARE)

## 2018-12-10 LAB — POCT URINALYSIS DIP (MANUAL ENTRY)
Bilirubin, UA: NEGATIVE
Glucose, UA: NEGATIVE mg/dL
Ketones, POC UA: NEGATIVE mg/dL
Leukocytes, UA: NEGATIVE
Nitrite, UA: NEGATIVE
Protein Ur, POC: NEGATIVE mg/dL
Spec Grav, UA: <=1.005 — AB (ref 1.010–1.025)
Urobilinogen, UA: 0.2 E.U./dL
pH, UA: 5.5 (ref 5.0–8.0)

## 2018-12-10 LAB — POCT FASTING CBG KUC MANUAL ENTRY: POCT Glucose (KUC): 84 mg/dL (ref 70–99)

## 2018-12-10 MED ORDER — AZITHROMYCIN 250 MG PO TABS: 250.0000 mg | ORAL_TABLET | Freq: Every day | ORAL | 0 refills | Status: DC

## 2018-12-10 NOTE — ED Provider Notes (Signed)
[name]    CSN: [id number] Arrival date & time: 12/10/18  1159     History   Chief Complaint Chief Complaint  Patient presents with  . Shortness of Breath  . Dizziness    HPI Gabrielle Torres is a 78 y.o. female.   HPI Gabrielle Torres is a 78 y.o. female presenting to UC with vague c/o intermittent SOB, dizziness, fatigue and "not feeling well" for the last 2 weeks. Symptoms tend to be worse in the morning and improve after eating but there are days like today when she become full quickly but still feels bad.  No hx of DM or asthma. She was a smoker for 50 years. Denies cough, congestion, fever, chills, sore throat or other URI symptoms. Denies vomiting or diarrhea. She notes her BP was low last month when she was seen by her home health nurse but today her BP is 155/77 and she still feels poor.  She has routine f/u in 1 month.   History reviewed. No pertinent past medical history.  Patient Active Problem List   Diagnosis Date Noted  . Idiopathic hypotension 01/14/2018  . Itchy, watery, and red eye 01/14/2018  . Hypertriglyceridemia 01/09/2017  . Post-menopausal 05/09/2016  . Piriformis syndrome of right side 05/09/2016  . Adenomatous polyp of colon 05/07/2016  . Vitamin D deficiency 12/28/2015  . Hyperlipidemia 12/28/2015    Past Surgical History:  Procedure Laterality Date  . CHOLECYSTECTOMY      OB History    Gravida  3   Para      Term      Preterm      AB      Living  3     SAB      TAB      Ectopic      Multiple      Live Births  3            Home Medications    Prior to Admission medications   Medication Sig Start Date End Date Taking? Authorizing Provider  AMBULATORY NON FORMULARY MEDICATION zostavax IM once for shingles prevention. Patient not taking: Reported on 11/17/2018 12/28/15   Donella Stade, PA-C  aspirin 81 MG tablet Take 81 mg by mouth daily.    [provider]  azithromycin (ZITHROMAX) 250 MG tablet  Take 1 tablet (250 mg total) by mouth daily. Take first 2 tablets together, then 1 every day until finished. 12/10/18   Noe Gens, PA-C  calcium-vitamin D (OSCAL WITH D) 500-200 MG-UNIT tablet Take 1 tablet by mouth.    [provider]  Omega-3 Fatty Acids (FISH OIL PO) Take 3,000 mg by mouth daily.    [provider]  pravastatin (PRAVACHOL) 40 MG tablet Take 1 tablet (40 mg total) by mouth daily. 05/23/18   Donella Stade, PA-C    Family History Family History  Problem Relation Age of Onset  . Depression Father   . Suicidality Father   . Breast cancer Mother 66  . Hypertension Son   . Hypertension Paternal Aunt     Social History Social History   Tobacco Use  . Smoking status: Former Research scientist (life sciences)  . Smokeless tobacco: Never Used  . Tobacco comment: quit 50 years ago  Substance Use Topics  . Alcohol use: No    Alcohol/week: 0.0 standard drinks    Comment: Occasional  . Drug use: No     Allergies   Patient has no known allergies.  Review of Systems Review of Systems  Constitutional: Positive for appetite change and fever. Negative for chills.  HENT: Negative for congestion, ear pain, postnasal drip, rhinorrhea and sore throat.   Respiratory: Positive for shortness of breath. Negative for cough and chest tightness.   Cardiovascular: Negative for chest pain and palpitations.  Gastrointestinal: Negative for diarrhea, nausea and vomiting.  Musculoskeletal: Negative for back pain and myalgias.  Neurological: Positive for dizziness and light-headedness. Negative for headaches.     Physical Exam Triage Vital Signs ED Triage Vitals [12/10/18 1212]  Enc Vitals Group     BP (!) 155/77     Pulse Rate 77     Resp 14     Temp 98.1 F (36.7 C)     Temp Source Oral     SpO2 99 %     Weight 169 lb (76.7 kg)     Height 5\' 8"  (1.727 m)     Head Circumference      Peak Flow      Pain Score 0     Pain Loc      Pain Edu?      Excl. in [place]?    No data  found.  Updated Vital Signs BP (!) 155/77 (BP Location: Right Arm)   Pulse 77   Temp 98.1 F (36.7 C) (Oral)   Resp 14   Ht 5\' 8"  (1.727 m)   Wt 169 lb (76.7 kg)   SpO2 99%   BMI 25.70 kg/m   Visual Acuity Right Eye Distance:   Left Eye Distance:   Bilateral Distance:    Right Eye Near:   Left Eye Near:    Bilateral Near:     Physical Exam Vitals signs and nursing note reviewed.  Constitutional:      Appearance: She is well-developed.  HENT:     Head: Normocephalic and atraumatic.     Right Ear: Tympanic membrane normal.     Left Ear: Tympanic membrane normal.     Nose: Nose normal.     Right Sinus: No maxillary sinus tenderness or frontal sinus tenderness.     Left Sinus: No maxillary sinus tenderness or frontal sinus tenderness.     Mouth/Throat:     Lips: Pink.     Mouth: Mucous membranes are moist.     Pharynx: Oropharynx is clear. Uvula midline.  Neck:     Musculoskeletal: Normal range of motion.  Cardiovascular:     Rate and Rhythm: Normal rate and regular rhythm.  Pulmonary:     Effort: Pulmonary effort is normal. No respiratory distress.     Breath sounds: No stridor. Examination of the right-lower field reveals decreased breath sounds. Examination of the left-lower field reveals decreased breath sounds. Decreased breath sounds present. No wheezing, rhonchi or rales.  Musculoskeletal: Normal range of motion.  Skin:    General: Skin is warm and dry.  Neurological:     Mental Status: She is alert and oriented to person, place, and time.  Psychiatric:        Behavior: Behavior normal.      UC Treatments / Results  Labs (all labs ordered are listed, but only abnormal results are displayed) Labs Reviewed  POCT URINALYSIS DIP (MANUAL ENTRY) - Abnormal; Notable for the following components:      Result Value   Spec Grav, UA <=1.005 (*)    Blood, UA trace-lysed (*)    All other components within normal limits  POCT CBC W AUTO DIFF ([facility])   POCT FASTING CBG [facility] MANUAL ENTRY    EKG EKG: Normal, see scanned EKG  Radiology Dg Chest 2 View  Result Date: 12/10/2018 CLINICAL DATA:  Short of breath EXAM: CHEST - 2 VIEW COMPARISON:  None. FINDINGS: The heart is moderately enlarged. Lungs are hyperaerated. Interstitial prominence. Ill-defined focal opacity at the right lung base. Lungs are otherwise clear. No pneumothorax or pleural effusion. IMPRESSION: Focal opacity at the right lung base is worrisome for focal airspace disease. Followup PA and lateral chest X-ray is recommended in 3-4 weeks following trial of antibiotic therapy to ensure resolution and exclude underlying malignancy. Electronically Signed   By: Marybelle Killings M.D.   On: 12/10/2018 12:53    Procedures Procedures (including critical care time)  Medications Ordered in UC Medications - No data to display  Initial Impression / Assessment and Plan / UC Course  I have reviewed the triage vital signs and the nursing notes.  Pertinent labs & imaging results that were available during my care of the patient were reviewed by me and considered in my medical decision making (see chart for details).     CBC, UA, CBG, and EKG: WNL CXR: concerning for Right lower lobe pneumonia Will start pt on Azithromycin Encouraged f/u with PCP   Final Clinical Impressions(s) / UC Diagnoses   Final diagnoses:  Dizziness  Community acquired pneumonia of right lower lobe of lung ([place])     Discharge Instructions      Please take antibiotics as prescribed and be sure to complete entire course even if you start to feel better to ensure infection does not come back.  Please call to schedule an appointment with your family doctor in 3-4 weeks for recheck of symptoms and repeat chest x-ray. Please follow up sooner if symptoms continue to worsen.  Call 911 or go to the hospital if worsening trouble breathing, passing out, unable to keep down fluids, or other new concerning symptoms  develop.    ED Prescriptions    Medication Sig Dispense Auth. Provider   azithromycin (ZITHROMAX) 250 MG tablet Take 1 tablet (250 mg total) by mouth daily. Take first 2 tablets together, then 1 every day until finished. 6 tablet Noe Gens, PA-C     Controlled Substance Prescriptions [place] Controlled Substance Registry consulted? Not Applicable   Tyrell Antonio 12/10/18 1341

## 2018-12-10 NOTE — Discharge Instructions (Signed)
°  Please take antibiotics as prescribed and be sure to complete entire course even if you start to feel better to ensure infection does not come back.  Please call to schedule an appointment with your family doctor in 3-4 weeks for recheck of symptoms and repeat chest x-ray. Please follow up sooner if symptoms continue to worsen.  Call 911 or go to the hospital if worsening trouble breathing, passing out, unable to keep down fluids, or other new concerning symptoms develop.

## 2018-12-10 NOTE — ED Triage Notes (Signed)
Patient c/o 2 weeks of SOB after activity and with talking. No recent respiratory illness. Quit smoking 50 years ago. Also c/o numbness to left side of face today at the same site where she 1 year ago fell and had a facial fracture.

## 2018-12-11 ENCOUNTER — Ambulatory Visit: Payer: Medicare Other | Admitting: Family Medicine

## 2018-12-12 ENCOUNTER — Telehealth: Payer: Self-pay | Admitting: *Deleted

## 2018-12-12 NOTE — Telephone Encounter (Signed)
Callback: Patient is improving. Encouraged to complete course of antibiotics.

## 2018-12-29 ENCOUNTER — Telehealth: Payer: Self-pay | Admitting: Physician Assistant

## 2018-12-29 DIAGNOSIS — J189 Pneumonia, unspecified organism: Secondary | ICD-10-CM

## 2018-12-29 DIAGNOSIS — J181 Lobar pneumonia, unspecified organism: Principal | ICD-10-CM

## 2018-12-29 NOTE — Telephone Encounter (Signed)
Pt called stating she was suppose to have repeat chest xray. Pt needs to know when and orders need to be placed.

## 2018-12-29 NOTE — Telephone Encounter (Signed)
This week will be 3 weeks. Ok to send order down.

## 2018-12-31 NOTE — Telephone Encounter (Signed)
Spoke with patient , she will come by tomorrow or Friday to have xray completed.

## 2019-01-01 ENCOUNTER — Ambulatory Visit (INDEPENDENT_AMBULATORY_CARE_PROVIDER_SITE_OTHER): Payer: Medicare Other

## 2019-01-01 DIAGNOSIS — J181 Lobar pneumonia, unspecified organism: Secondary | ICD-10-CM

## 2019-01-01 DIAGNOSIS — R05 Cough: Secondary | ICD-10-CM | POA: Diagnosis not present

## 2019-01-01 DIAGNOSIS — J189 Pneumonia, unspecified organism: Secondary | ICD-10-CM

## 2019-01-02 NOTE — Progress Notes (Signed)
Call pt: CXR looks good. Everything seen previously appeared to clear. GREAT news.

## 2019-01-13 ENCOUNTER — Ambulatory Visit (INDEPENDENT_AMBULATORY_CARE_PROVIDER_SITE_OTHER): Payer: Medicare Other | Admitting: Physician Assistant

## 2019-01-13 ENCOUNTER — Encounter: Payer: Self-pay | Admitting: Physician Assistant

## 2019-01-13 VITALS — BP 134/90 | HR 77 | Ht 68.0 in | Wt 166.0 lb

## 2019-01-13 DIAGNOSIS — E782 Mixed hyperlipidemia: Secondary | ICD-10-CM | POA: Diagnosis not present

## 2019-01-13 DIAGNOSIS — K219 Gastro-esophageal reflux disease without esophagitis: Secondary | ICD-10-CM | POA: Diagnosis not present

## 2019-01-13 DIAGNOSIS — Z23 Encounter for immunization: Secondary | ICD-10-CM

## 2019-01-13 DIAGNOSIS — Z131 Encounter for screening for diabetes mellitus: Secondary | ICD-10-CM

## 2019-01-13 DIAGNOSIS — M810 Age-related osteoporosis without current pathological fracture: Secondary | ICD-10-CM

## 2019-01-13 MED ORDER — AMBULATORY NON FORMULARY MEDICATION: 0 refills | Status: DC

## 2019-01-13 NOTE — Progress Notes (Signed)
   Subjective:    Patient ID: Gabrielle Torres, female    DOB: Apr 27, 1941, 78 y.o.   MRN: [id number]  HPI  Pt is a 78 yo female with hyperlipidemia and hx of elevated serum creatine who presents to the clinic for follow up.   She is doing great. She has fully recovered from CAP in January. No problems or concerns.   She has colonoscopy scheduled for next month.   She has been avoiding NSAIDs and eating healthy. Needs recheck on creatine that hx of elevation.   .. Active Ambulatory Problems    Diagnosis Date Noted  . Vitamin D deficiency 12/28/2015  . Hyperlipidemia 12/28/2015  . Adenomatous polyp of colon 05/07/2016  . Post-menopausal 05/09/2016  . Piriformis syndrome of right side 05/09/2016  . Hypertriglyceridemia 01/09/2017  . Idiopathic hypotension 01/14/2018  . Itchy, watery, and red eye 01/14/2018  . Age-related osteoporosis without current pathological fracture 01/13/2019   Resolved Ambulatory Problems    Diagnosis Date Noted  . CKD (chronic kidney disease) stage 3, GFR 30-59 ml/min ([facility]) 02/06/2017  . Mass of right wrist 04/15/2018   No Additional Past Medical History       Review of Systems  All other systems reviewed and are negative.      Objective:   Physical Exam Vitals signs reviewed.  Constitutional:      Appearance: Normal appearance.  HENT:     Head: Normocephalic and atraumatic.  Cardiovascular:     Rate and Rhythm: Normal rate and regular rhythm.     Pulses: Normal pulses.  Pulmonary:     Effort: Pulmonary effort is normal.     Breath sounds: Normal breath sounds.  Neurological:     General: No focal deficit present.     Mental Status: She is alert and oriented to person, place, and time.  Psychiatric:        Mood and Affect: Mood normal.        Behavior: Behavior normal.           Assessment & Plan:  .Marland KitchenRisa was seen today for follow-up.  Diagnoses and all orders for this visit:  Age-related osteoporosis without current pathological  fracture -     DG Bone Density  Screening for diabetes mellitus -     Basic metabolic panel  Mixed hyperlipidemia -     Lipid Panel w/reflex Direct LDL  Need for shingles vaccine -     AMBULATORY NON FORMULARY MEDICATION; shingrx 2 doses to prevent shingles.  Gastroesophageal reflux disease without esophagitis     Mammogram. Due to age pt decided to decrease to every 2 years.  Colonoscopy will be done next month.  Recheck fasting labs.  Encouraged shingrix. Printed rx to have done at pharmacy.   Pt is osteoporotic and not on any medications. Discussed medications. Checked in 2017. Wants rechecked and then will discuss. She is taking vitamin D and calcium she is exericising.

## 2019-01-15 DIAGNOSIS — E782 Mixed hyperlipidemia: Secondary | ICD-10-CM | POA: Diagnosis not present

## 2019-01-15 DIAGNOSIS — Z131 Encounter for screening for diabetes mellitus: Secondary | ICD-10-CM | POA: Diagnosis not present

## 2019-01-16 LAB — BASIC METABOLIC PANEL
BUN/Creatinine Ratio: 13 (calc) (ref 6–22)
BUN: 13 mg/dL (ref 7–25)
CO2: 25 mmol/L (ref 20–32)
Calcium: 9.1 mg/dL (ref 8.6–10.4)
Chloride: 109 mmol/L (ref 98–110)
Creat: 1.03 mg/dL — ABNORMAL HIGH (ref 0.60–0.93)
Glucose, Bld: 86 mg/dL (ref 65–99)
Potassium: 4.6 mmol/L (ref 3.5–5.3)
Sodium: 141 mmol/L (ref 135–146)

## 2019-01-16 LAB — LIPID PANEL W/REFLEX DIRECT LDL
Cholesterol: 155 mg/dL (ref <200)
HDL: 67 mg/dL (ref >=50)
LDL Cholesterol (Calc): 71 mg/dL (calc)
Non-HDL Cholesterol (Calc): 88 mg/dL (calc) (ref <130)
Total CHOL/HDL Ratio: 2.3 (calc) (ref <5.0)
Triglycerides: 90 mg/dL (ref <150)

## 2019-01-16 NOTE — Progress Notes (Signed)
Call pt: cholesterol looks fantastic. Liver and glucose look great. Your creatine did go up just a hair. Not worrisome. Will check every 6 months. Could be a recent virus, not enough fluid for the day etc. I would not worry about it.

## 2019-01-21 ENCOUNTER — Ambulatory Visit (INDEPENDENT_AMBULATORY_CARE_PROVIDER_SITE_OTHER): Payer: Medicare Other

## 2019-01-21 DIAGNOSIS — M8589 Other specified disorders of bone density and structure, multiple sites: Secondary | ICD-10-CM

## 2019-01-21 DIAGNOSIS — M85832 Other specified disorders of bone density and structure, left forearm: Secondary | ICD-10-CM | POA: Diagnosis not present

## 2019-01-21 NOTE — Progress Notes (Signed)
Pt is osteopenic(low bone mass) continue on vitamin D and calcium. We can recheck in 2 years to look at any progression into osteoporosis.

## 2019-02-03 ENCOUNTER — Ambulatory Visit (INDEPENDENT_AMBULATORY_CARE_PROVIDER_SITE_OTHER): Payer: Medicare Other | Admitting: Physician Assistant

## 2019-02-03 ENCOUNTER — Encounter: Payer: Self-pay | Admitting: Physician Assistant

## 2019-02-03 VITALS — BP 122/63 | HR 72 | Temp 97.9°F | Wt 166.0 lb

## 2019-02-03 DIAGNOSIS — R0781 Pleurodynia: Secondary | ICD-10-CM | POA: Diagnosis not present

## 2019-02-03 DIAGNOSIS — M8580 Other specified disorders of bone density and structure, unspecified site: Secondary | ICD-10-CM | POA: Insufficient documentation

## 2019-02-03 NOTE — Progress Notes (Signed)
Subjective:    Patient ID: Gabrielle Torres, female    DOB: 1941-05-18, 78 y.o.   MRN: [id number]  HPI  Gabrielle Torres is a 78 y.o osteopenic patient that presents today with left sided rib pain that began nine days ago following a fall in her kitchen. She denies hitting her head, losing consciousness or injuring any other part of her body. The pain is located under her left breast. She describes it as sharp when it occurs but rates it a 3 out of 10 currently. It was a 10/10 when it occurred. She has been icing the area and using tylenol to help with the pain. She says it has been helping. Coughing and rolling on that side at night makes the pain worse. She has been able to stay active and work through the pain. She does endorse swelling in the area but denies any shortness of breathe, tingling, numbness or bruising.    .. Active Ambulatory Problems    Diagnosis Date Noted  . Vitamin D deficiency 12/28/2015  . Hyperlipidemia 12/28/2015  . Adenomatous polyp of colon 05/07/2016  . Post-menopausal 05/09/2016  . Piriformis syndrome of right side 05/09/2016  . Hypertriglyceridemia 01/09/2017  . Idiopathic hypotension 01/14/2018  . Itchy, watery, and red eye 01/14/2018  . Age-related osteoporosis without current pathological fracture 01/13/2019   Resolved Ambulatory Problems    Diagnosis Date Noted  . CKD (chronic kidney disease) stage 3, GFR 30-59 ml/min (HCC) 02/06/2017  . Mass of right wrist 04/15/2018   No Additional Past Medical History      Review of Systems See HPI    Objective:   Physical Exam Constitutional:      Appearance: Normal appearance.  HENT:     Head: Normocephalic.     Nose: Nose normal.     Mouth/Throat:     Pharynx: Oropharynx is clear.  Eyes:     Conjunctiva/sclera: Conjunctivae normal.  Cardiovascular:     Rate and Rhythm: Normal rate and regular rhythm.     Heart sounds: Normal heart sounds.  Pulmonary:     Breath sounds: Normal breath sounds.  Musculoskeletal:         General: Tenderness and deformity present.     Comments: Step off felt along rib  Skin:    General: Skin is warm.     Capillary Refill: Capillary refill takes less than 2 seconds.  Neurological:     General: No focal deficit present.     Mental Status: She is alert.  Psychiatric:        Mood and Affect: Mood normal.           Assessment & Plan:  Marland KitchenMarland KitchenDiagnoses and all orders for this visit:  Rib pain on left side   Likely a rib fracture I did palpate some step off and pain over that area. -Continue to apply ice to the area for inflammation - Tylenol or NSAIDs for pain control. Advised patient that is okay to take NSAIDs for short period of time to help with the pain, that it should not affect her kidneys.  -Continue to take Vitamin D and Calcium to help promote bone growth and strengthen prexisting bone.  Pt just had bone density 01/2019 and started vitamin d and calcium. Discussed getting imaging to confirm fracture but not change plan. She declined today. If confirmed fracture there could be a indication to start bisphonates earlier. Pt does not want to do that without giving vitamin D and calcum more time to work.  -  pt will continue with Tai Chi for exercise and bone strength.    ..I, Iran Planas PA-C, have reviewed and agree with the above documentation in it's entirety.

## 2019-02-03 NOTE — Patient Instructions (Signed)
Rib Fracture ° °A rib fracture is a break or crack in one of the bones of the ribs. The ribs are like a cage that goes around your upper chest. A broken or cracked rib is often painful, but most do not cause other problems. Most rib fractures usually heal on their own in 1-3 months. °Follow these instructions at home: °Managing pain, stiffness, and swelling °· If directed, apply ice to the injured area. °? Put ice in a plastic bag. °? Place a towel between your skin and the bag. °? Leave the ice on for 20 minutes, 2-3 times a day. °· Take over-the-counter and prescription medicines only as told by your doctor. °Activity °· Avoid activities that cause pain to the injured area. Protect your injured area. °· Slowly increase activity as told by your doctor. °General instructions °· Do deep breathing as told by your doctor. You may be told to: °? Take deep breaths many times a day. °? Cough many times a day while hugging a pillow. °? Use a device (incentive spirometer) to do deep breathing many times a day. °· Drink enough fluid to keep your pee (urine) clear or pale yellow. °· Do not wear a rib belt or binder. These do not allow you to breathe deeply. °· Keep all follow-up visits as told by your doctor. This is important. °Contact a doctor if: °· You have a fever. °Get help right away if: °· You have trouble breathing. °· You are short of breath. °· You cannot stop coughing. °· You cough up thick or bloody spit (sputum). °· You feel sick to your stomach (nauseous), throw up (vomit), or have belly (abdominal) pain. °· Your pain gets worse and medicine does not help. °Summary °· A rib fracture is a break or crack in one of the bones of the ribs. °· Apply ice to the injured area and take medicines for pain as told by your doctor. °· Take deep breaths and cough many times a day. Hug a pillow every time you cough. °This information is not intended to replace advice given to you by your health care provider. Make sure you  discuss any questions you have with your health care provider. °Document Released: 08/28/2008 Document Revised: 02/19/2017 Document Reviewed: 02/19/2017 °Elsevier Interactive Patient Education © 2019 Else[employer] Inc. ° °

## 2019-02-03 NOTE — Progress Notes (Signed)
k

## 2019-07-02 DIAGNOSIS — Z86010 Personal history of colonic polyps: Secondary | ICD-10-CM | POA: Diagnosis not present

## 2019-07-02 DIAGNOSIS — D122 Benign neoplasm of ascending colon: Secondary | ICD-10-CM | POA: Diagnosis not present

## 2019-07-02 DIAGNOSIS — D125 Benign neoplasm of sigmoid colon: Secondary | ICD-10-CM | POA: Diagnosis not present

## 2019-07-02 DIAGNOSIS — D124 Benign neoplasm of descending colon: Secondary | ICD-10-CM | POA: Diagnosis not present

## 2019-07-02 LAB — HM COLONOSCOPY

## 2019-07-06 LAB — HM COLONOSCOPY

## 2019-09-14 ENCOUNTER — Ambulatory Visit (INDEPENDENT_AMBULATORY_CARE_PROVIDER_SITE_OTHER): Payer: Medicare Other | Admitting: Physician Assistant

## 2019-09-14 ENCOUNTER — Other Ambulatory Visit: Payer: Self-pay

## 2019-09-14 DIAGNOSIS — Z23 Encounter for immunization: Secondary | ICD-10-CM | POA: Diagnosis not present

## 2019-09-18 ENCOUNTER — Other Ambulatory Visit: Payer: Self-pay | Admitting: Neurology

## 2019-09-18 MED ORDER — PRAVASTATIN SODIUM 40 MG PO TABS: 40.0000 mg | ORAL_TABLET | Freq: Every day | ORAL | 1 refills | Status: DC

## 2019-10-12 ENCOUNTER — Encounter: Payer: Self-pay | Admitting: Physician Assistant

## 2019-10-28 ENCOUNTER — Other Ambulatory Visit: Payer: Self-pay

## 2019-11-10 DIAGNOSIS — R300 Dysuria: Secondary | ICD-10-CM | POA: Diagnosis not present

## 2019-11-16 NOTE — Progress Notes (Signed)
Subjective:   Gabrielle Torres is a 78 y.o. female who presents for Medicare Annual (Subsequent) preventive examination.  Review of Systems:  No ROS.  Medicare Wellness Virtual Visit.  Visual/audio telehealth visit, UTA vital signs.   See social history for additional risk factors.    Cardiac Risk Factors include: advanced age (>10mn, >>24women) Sleep patterns: Getting 9 hours of sleep a night. Wakes up 4 times a night to void. Wakes up and feels rested. Home Safety/Smoke Alarms: Feels safe in home. Smoke alarms in place.  Living environment; Lives with daughter now in a 1 story home and hand rails are on the steps to loft. Shower is a walk in shower with grab bars in place. Seat Belt Safety/Bike Helmet: Wears seat belt.   Female:   Pap- Aged out       Mammo- UTD      Dexa scan- UTD       CCS- UTD     Objective:     Vitals: Ht 5' 8"  (1.727 m)   Wt 166 lb (75.3 kg)   BMI 25.24 kg/m   Body mass index is 25.24 kg/m.  Advanced Directives 11/23/2019 11/17/2018 12/28/2015  Does Patient Have a Medical Advance Directive? No No No  Would patient like information on creating a medical advance directive? No - Patient declined No - Patient declined Yes - EScientist, clinical (histocompatibility and immunogenetics)given    Tobacco Social History   Tobacco Use  Smoking Status Former Smoker  Smokeless Tobacco Never Used  Tobacco Comment   quit 50 years ago     Counseling given: Not Answered Comment: quit 50 years ago   Clinical Intake:  Pre-visit preparation completed: Yes  Pain : No/denies pain     Nutritional Risks: None Diabetes: No  How often do you need to have someone help you when you read instructions, pamphlets, or other written materials from your doctor or pharmacy?: 1 - Never What is the last grade level you completed in school?: 12  Interpreter Needed?: No  Information entered by :: KOrlie Dakin LPN  History reviewed. No pertinent past medical history. Past Surgical History:  Procedure  Laterality Date  . CHOLECYSTECTOMY     Family History  Problem Relation Age of Onset  . Depression Father   . Suicidality Father   . Breast cancer Mother 522 . Hypertension Son   . Hypertension Paternal Aunt   . Atrial fibrillation Daughter    Social History   Socioeconomic History  . Marital status: Widowed    Spouse name: Not on file  . Number of children: 3  . Years of education: 140 . Highest education level: 12th grade  Occupational History  . Occupation: retired    Comment: housekeeper  Tobacco Use  . Smoking status: Former SResearch scientist (life sciences) . Smokeless tobacco: Never Used  . Tobacco comment: quit 50 years ago  Substance and Sexual Activity  . Alcohol use: No    Alcohol/week: 0.0 standard drinks    Comment: Occasional  . Drug use: No  . Sexual activity: Not Currently    Birth control/protection: None, Post-menopausal  Other Topics Concern  . Not on file  Social History Narrative   Does [place] 2 times a week. Plays cards with friends during the week. Helps take  Care of daughters horse 2 times a week. Very active. Does a lot of games, reading. Caffeine minimal use. Daughter moving in with her next week from [place] and will be living with her  Social Determinants of Health   Financial Resource Strain:   . Difficulty of Paying Living Expenses: Not on file  Food Insecurity:   . Worried About Charity fundraiser in the Last Year: Not on file  . Ran Out of Food in the Last Year: Not on file  Transportation Needs:   . Lack of Transportation (Medical): Not on file  . Lack of Transportation (Non-Medical): Not on file  Physical Activity:   . Days of Exercise per Week: Not on file  . Minutes of Exercise per Session: Not on file  Stress:   . Feeling of Stress : Not on file  Social Connections:   . Frequency of Communication with Friends and Family: Not on file  . Frequency of Social Gatherings with Friends and Family: Not on file  . Attends Religious Services: Not on  file  . Active Member of Clubs or Organizations: Not on file  . Attends Archivist Meetings: Not on file  . Marital Status: Not on file    Outpatient Encounter Medications as of 11/23/2019  Medication Sig  . aspirin 81 MG tablet Take 81 mg by mouth daily.  . calcium-vitamin D (OSCAL WITH D) 500-200 MG-UNIT tablet Take 1 tablet by mouth.  . famotidine (PEPCID) 20 MG tablet Take 20 mg by mouth 2 (two) times daily.  . Omega-3 Fatty Acids (FISH OIL PO) Take 3,000 mg by mouth daily.  . pravastatin (PRAVACHOL) 40 MG tablet Take 1 tablet (40 mg total) by mouth daily.  . AMBULATORY NON FORMULARY MEDICATION shingrx 2 doses to prevent shingles. (Patient not taking: Reported on 11/23/2019)   No facility-administered encounter medications on file as of 11/23/2019.    Activities of Daily Living In your present state of health, do you have any difficulty performing the following activities: 11/23/2019  Hearing? Y  Comment right ear some hearing loss  Vision? N  Difficulty concentrating or making decisions? N  Walking or climbing stairs? N  Dressing or bathing? N  Doing errands, shopping? N  Preparing Food and eating ? N  Using the Toilet? N  In the past six months, have you accidently leaked urine? N  Do you have problems with loss of bowel control? N  Managing your Medications? N  Managing your Finances? N  Housekeeping or managing your Housekeeping? N  Some recent data might be hidden    Patient Care Team: Lavada Mesi as PCP - General (Family Medicine)    Assessment:   This is a routine wellness examination for [name].Physical assessment deferred to PCP.   Exercise Activities and Dietary recommendations Current Exercise Habits: Home exercise routine, Type of exercise: treadmill(tai chi), Time (Minutes): 20, Frequency (Times/Week): 7, Weekly Exercise (Minutes/Week): 140, Intensity: Mild, Exercise limited by: None identified Diet Eats a healthy diet no beef, chicken  and rice Breakfast: rice cereal with fruit Lunch: chicken or sandwich or tuna Dinner: chicken and vegetables. Drinks 64 ounces of water daily.      Goals    . Patient Stated     Patient stated she wanted to add more fruits and vegetables into her diet and exercise more for longer periods of time.       Fall Risk Fall Risk  11/23/2019 11/17/2018 10/22/2018 06/24/2017  Falls in the past year? 1 0 0 No  Comment - - Emmi Telephone Survey: data to providers prior to load Emmi Telephone Survey: data to providers prior to load  Number falls in past yr: 0 - - -  Injury with Fall? 1 - - -  Comment cracked rib from fall in kitchen - - -  Follow up Falls prevention discussed - - -   Is the patient's home free of loose throw rugs in walkways, pet beds, electrical cords, etc?   yes      Grab bars in the bathroom? yes      Handrails on the stairs?   yes      Adequate lighting?   yes   Depression Screen PHQ 2/9 Scores 11/23/2019 02/03/2019 01/13/2019 11/17/2018  PHQ - 2 Score 0 0 0 0  PHQ- 9 Score - 0 - -     Cognitive Function     6CIT Screen 11/23/2019 11/17/2018  What Year? 0 points 0 points  What month? 0 points 0 points  What time? 0 points 0 points  Count back from 20 0 points 0 points  Months in reverse 0 points 2 points  Repeat phrase 0 points 0 points  Total Score 0 2    Immunization History  Administered Date(s) Administered  . Fluad Quad(high Dose 65+) 09/14/2019  . Influenza, High Dose Seasonal PF 12/31/2017  . Influenza,inj,Quad PF,6+ Mos 01/09/2017, 11/17/2018  . Pneumococcal Conjugate-13 12/28/2015  . Pneumococcal Polysaccharide-23 01/09/2017  . Tdap 12/28/2015    Screening Tests Health Maintenance  Topic Date Due  . MAMMOGRAM  01/17/2020  . COLONOSCOPY  07/01/2022  . TETANUS/TDAP  11/25/2027  . INFLUENZA VACCINE  Completed  . DEXA SCAN  Completed  . PNA vac Low Risk Adult  Completed      Plan:    Please schedule your next medicare wellness visit with  me in 1 yr.  Ms. Negron , Thank you for taking time to come for your Medicare Wellness Visit. I appreciate your ongoing commitment to your health goals. Please review the following plan we discussed and let me know if I can assist you in the future.  Continue doing brain stimulating activities (puzzles, reading, adult coloring books, staying active) to keep memory sharp.    These are the goals we discussed: Goals    . Patient Stated     Patient stated she wanted to add more fruits and vegetables into her diet and exercise more for longer periods of time.       This is a list of the screening recommended for you and due dates:  Health Maintenance  Topic Date Due  . Mammogram  01/17/2020  . Colon Cancer Screening  07/01/2022  . Tetanus Vaccine  11/25/2027  . Flu Shot  Completed  . DEXA scan (bone density measurement)  Completed  . Pneumonia vaccines  Completed      I have personally reviewed and noted the following in the patient's chart:   . Medical and social history . Use of alcohol, tobacco or illicit drugs  . Current medications and supplements . Functional ability and status . Nutritional status . Physical activity . Advanced directives . List of other physicians . Hospitalizations, surgeries, and ER visits in previous 12 months . Vitals . Screenings to include cognitive, depression, and falls . Referrals and appointments  In addition, I have reviewed and discussed with patient certain preventive protocols, quality metrics, and best practice recommendations. A written personalized care plan for preventive services as well as general preventive health recommendations were provided to patient.     Joanne Chars, LPN  [id number]

## 2019-11-23 ENCOUNTER — Ambulatory Visit (INDEPENDENT_AMBULATORY_CARE_PROVIDER_SITE_OTHER): Payer: Medicare Other | Admitting: *Deleted

## 2019-11-23 VITALS — Ht 68.0 in | Wt 166.0 lb

## 2019-11-23 DIAGNOSIS — Z0000 Encounter for general adult medical examination without abnormal findings: Secondary | ICD-10-CM | POA: Diagnosis not present

## 2019-11-23 NOTE — Patient Instructions (Addendum)
Please schedule your next medicare wellness visit with me in 1 yr.  Gabrielle Torres , Thank you for taking time to come for your Medicare Wellness Visit. I appreciate your ongoing commitment to your health goals. Please review the following plan we discussed and let me know if I can assist you in the future.  Continue doing brain stimulating activities (puzzles, reading, adult coloring books, staying active) to keep memory sharp.   These are the goals we discussed: Goals    . Patient Stated     Patient stated she wanted to add more fruits and vegetables into her diet and exercise more for longer periods of time.

## 2019-12-17 ENCOUNTER — Encounter: Payer: Self-pay | Admitting: Medical-Surgical

## 2019-12-17 ENCOUNTER — Other Ambulatory Visit: Payer: Self-pay

## 2019-12-17 ENCOUNTER — Ambulatory Visit (INDEPENDENT_AMBULATORY_CARE_PROVIDER_SITE_OTHER): Payer: Medicare Other | Admitting: Medical-Surgical

## 2019-12-17 VITALS — BP 95/62 | HR 84 | Temp 97.8°F | Ht 68.0 in | Wt 166.0 lb

## 2019-12-17 DIAGNOSIS — R300 Dysuria: Secondary | ICD-10-CM

## 2019-12-17 DIAGNOSIS — K5900 Constipation, unspecified: Secondary | ICD-10-CM

## 2019-12-17 DIAGNOSIS — F439 Reaction to severe stress, unspecified: Secondary | ICD-10-CM

## 2019-12-17 LAB — POCT URINALYSIS DIP (CLINITEK)
Bilirubin, UA: NEGATIVE
Glucose, UA: NEGATIVE mg/dL
Ketones, POC UA: NEGATIVE mg/dL
Nitrite, UA: NEGATIVE
POC PROTEIN,UA: NEGATIVE
Spec Grav, UA: <=1.005 — AB (ref 1.010–1.025)
Urobilinogen, UA: 0.2 E.U./dL
pH, UA: 5.5 (ref 5.0–8.0)

## 2019-12-17 MED ORDER — PHENAZOPYRIDINE HCL 100 MG PO TABS: 100.0000 mg | ORAL_TABLET | Freq: Three times a day (TID) | ORAL | 0 refills | Status: DC | PRN

## 2019-12-17 MED ORDER — CIPROFLOXACIN HCL 500 MG PO TABS: 500.0000 mg | ORAL_TABLET | Freq: Two times a day (BID) | ORAL | 0 refills | Status: AC

## 2019-12-17 NOTE — Progress Notes (Signed)
Gabrielle Torres is a 79 y.o. female presenting to [facility] today, 12/17/19, seeking care for the following:  Dysuria-  Was seen 12/8 in [place] and treated for UTI with Nitrofurantoin. S/s resolved but returned 2 days ago. + burning, frequency, nocturia, urgency, and suprapubic pain. (-) flank pain, fever, chills, N/V/D. Hydrates well. Urine light yellow, clear, no visible blood.  Increased stress- Helping daughter move from [place] to [facility]. Has been worried over getting daughter settled in. Does Tai Chi weekly and has good social support.  Constipation-  Doesn't feel she eats enough fiber. Drinking at least 64oz water daily plus tea and coffee.    Assessment & Plan:  Dysuria UA negative nitrites, small leuks, small blood. Sending urine for Microscopy and Culture. Pyridium TID as needed. Cipro BID x 7 days. If s/s recur will need pelvic exam to evaluate for atrophic vaginitis.  Increased stress- Continue Tai Chi, consider increasing frequency to twice weekly. Consider meditation and journaling if desired.  Constipation- increase fluids. Consider OTC fiber supplement as needed.  Follow-up instructions: Return if symptoms worsen or fail to improve. Advised to contact the office if no improvement within 48-72 hours.  Records reviewed (notes, test results, etc.): Yes  BP 95/62   Pulse 84   Temp 97.8 F (36.6 C) (Oral)   Ht 5' 8"  (1.727 m)   Wt 166 lb (75.3 kg)   SpO2 100%   BMI 25.24 kg/m   Current Meds  Medication Sig  . AMBULATORY NON FORMULARY MEDICATION shingrx 2 doses to prevent shingles.  Marland Kitchen aspirin 81 MG tablet Take 81 mg by mouth daily.  . calcium-vitamin D (OSCAL WITH D) 500-200 MG-UNIT tablet Take 1 tablet by mouth.  . famotidine (PEPCID) 20 MG tablet Take 20 mg by mouth 2 (two) times daily.  . Omega-3 Fatty Acids (FISH OIL PO) Take 3,000 mg by mouth daily.  . pravastatin (PRAVACHOL) 40 MG tablet Take 1 tablet (40 mg total) by mouth  daily.    Results for orders placed or performed in visit on 12/17/19 (from the past 72 hour(s))  POCT UA     Status: Abnormal   Collection Time: 12/17/19  1:19 PM  Result Value Ref Range   Color, UA light yellow (A) yellow   Clarity, UA clear clear   Glucose, UA negative negative mg/dL   Bilirubin, UA negative negative   Ketones, POC UA negative negative mg/dL   Spec Grav, UA <=1.005 (A) 1.010 - 1.025   Blood, UA small (A) negative   pH, UA 5.5 5.0 - 8.0   POC PROTEIN,UA negative negative, trace   Urobilinogen, UA 0.2 0.2 or 1.0 E.U./dL   Nitrite, UA Negative Negative   Leukocytes, UA Small (1+) (A) Negative    No results found.  Clearnce Sorrel, DNP, APRN, FNP-BC [facility]

## 2019-12-18 LAB — URINALYSIS, MICROSCOPIC ONLY
Hyaline Cast: NONE SEEN /LPF
RBC / HPF: NONE SEEN /HPF (ref 0–2)
Squamous Epithelial / HPF: NONE SEEN /HPF (ref <=5)

## 2019-12-19 LAB — URINE CULTURE
MICRO NUMBER:: 10043414
SPECIMEN QUALITY:: ADEQUATE

## 2019-12-31 ENCOUNTER — Encounter: Payer: Self-pay | Admitting: Medical-Surgical

## 2019-12-31 ENCOUNTER — Telehealth (INDEPENDENT_AMBULATORY_CARE_PROVIDER_SITE_OTHER): Payer: Medicare Other | Admitting: Medical-Surgical

## 2019-12-31 ENCOUNTER — Other Ambulatory Visit: Payer: Self-pay

## 2019-12-31 ENCOUNTER — Ambulatory Visit (INDEPENDENT_AMBULATORY_CARE_PROVIDER_SITE_OTHER): Payer: Medicare Other | Admitting: Medical-Surgical

## 2019-12-31 VITALS — BP 86/54 | HR 92 | Temp 97.7°F | Ht 68.0 in | Wt 165.0 lb

## 2019-12-31 DIAGNOSIS — R3915 Urgency of urination: Secondary | ICD-10-CM | POA: Diagnosis not present

## 2019-12-31 DIAGNOSIS — R300 Dysuria: Secondary | ICD-10-CM | POA: Diagnosis not present

## 2019-12-31 DIAGNOSIS — R350 Frequency of micturition: Secondary | ICD-10-CM | POA: Diagnosis not present

## 2019-12-31 DIAGNOSIS — N952 Postmenopausal atrophic vaginitis: Secondary | ICD-10-CM | POA: Diagnosis not present

## 2019-12-31 LAB — POCT URINALYSIS DIP (CLINITEK)
Bilirubin, UA: NEGATIVE
Glucose, UA: NEGATIVE mg/dL
Ketones, POC UA: NEGATIVE mg/dL
Leukocytes, UA: NEGATIVE
Nitrite, UA: NEGATIVE
POC PROTEIN,UA: NEGATIVE
Spec Grav, UA: <=1.005 — AB (ref 1.010–1.025)
Urobilinogen, UA: 0.2 E.U./dL
pH, UA: 5.5 (ref 5.0–8.0)

## 2019-12-31 MED ORDER — MIRABEGRON ER 25 MG PO TB24: 25.0000 mg | ORAL_TABLET | Freq: Every day | ORAL | 0 refills | Status: DC

## 2019-12-31 MED ORDER — ESTRADIOL 0.1 MG/GM VA CREA: 1.0000 | TOPICAL_CREAM | Freq: Every day | VAGINAL | 12 refills | Status: DC

## 2019-12-31 MED ORDER — ESTRADIOL 0.1 MG/GM VA CREA: TOPICAL_CREAM | VAGINAL | 12 refills | Status: DC

## 2019-12-31 MED ORDER — OXYBUTYNIN CHLORIDE ER 5 MG PO TB24: 5.0000 mg | ORAL_TABLET | Freq: Every day | ORAL | 0 refills | Status: DC

## 2019-12-31 NOTE — Telephone Encounter (Signed)
Pt states estradiol is >$100 and the pills are >$400. Pt is requesting an alternative for each Rx.

## 2019-12-31 NOTE — Telephone Encounter (Signed)
Myrbetriq changed to Ditropan. This is also a once a day medication that is taken at bedtime. Unfortunately, the only alternative to the Estradiol cream is Premarin and it is much more expensive. I did find estradiol cream costs approximately $50 dollars at [facility] in [place]. I can send it there if that is more manageable.

## 2019-12-31 NOTE — Telephone Encounter (Signed)
PT requested a change in medication. Looks like the price is to high for the PT Please advise.  Estradiol  Mirabegron 25

## 2019-12-31 NOTE — Addendum Note (Signed)
Addended bySamuel Bouche on: 12/31/2019 04:32 PM   Modules accepted: Level of Service

## 2019-12-31 NOTE — Patient Instructions (Signed)
If you need to call, please ask for Samuel Bouche, NP or Glennie Hawk, MA with questions.

## 2019-12-31 NOTE — Progress Notes (Signed)
Subjective:    CC: continued UTI sxs  HPI: Gabrielle Torres is a pleasant 79 year old female presenting today for continued urinary s/s including urinary frequency, urgency, and suprapubic pressure.  Was seen on 12/8 in [place] and treated for UTI with Macrobid.  Returned on 1/14, found to have E. coli UTI, treated with Cipro.  Continues to have nocturia 5-7 times per night.  Has increased water intake, stopping by 6:00 each night to help prevent nighttime waking.  No fever, chills, low back pain, burning with urination, nausea/vomiting.  Blood pressure 86/54 in office.  No chest pain, shortness of breath, lightheadedness, palpitations.  History of idiopathic hypotension.  Staying well hydrated.  Was able to do her tai chi workout this morning without difficulty.  I reviewed the past medical history, family history, social history, surgical history, and allergies today and no changes were needed.  Please see the problem list section below in epic for further details.  Past Medical History: No past medical history on file. Past Surgical History: Past Surgical History:  Procedure Laterality Date  . CHOLECYSTECTOMY     Social History: Social History   Socioeconomic History  . Marital status: Widowed    Spouse name: Not on file  . Number of children: 3  . Years of education: 10  . Highest education level: 12th grade  Occupational History  . Occupation: retired    Comment: housekeeper  Tobacco Use  . Smoking status: Former Research scientist (life sciences)  . Smokeless tobacco: Never Used  . Tobacco comment: quit 50 years ago  Substance and Sexual Activity  . Alcohol use: No    Alcohol/week: 0.0 standard drinks    Comment: Occasional  . Drug use: No  . Sexual activity: Not Currently    Birth control/protection: None, Post-menopausal  Other Topics Concern  . Not on file  Social History Narrative   Does [place] [employer] 2 times a week. Plays cards with friends during the week. Helps take  Care of daughters horse 2 times  a week. Very active. Does a lot of games, reading. Caffeine minimal use. Daughter moving in with her next week from [place] and will be living with her   Social Determinants of Health   Financial Resource Strain:   . Difficulty of Paying Living Expenses: Not on file  Food Insecurity:   . Worried About Charity fundraiser in the Last Year: Not on file  . Ran Out of Food in the Last Year: Not on file  Transportation Needs:   . Lack of Transportation (Medical): Not on file  . Lack of Transportation (Non-Medical): Not on file  Physical Activity:   . Days of Exercise per Week: Not on file  . Minutes of Exercise per Session: Not on file  Stress:   . Feeling of Stress : Not on file  Social Connections:   . Frequency of Communication with Friends and Family: Not on file  . Frequency of Social Gatherings with Friends and Family: Not on file  . Attends Religious Services: Not on file  . Active Member of Clubs or Organizations: Not on file  . Attends Archivist Meetings: Not on file  . Marital Status: Not on file   Family History: Family History  Problem Relation Age of Onset  . Depression Father   . Suicidality Father   . Breast cancer Mother 98  . Hypertension Son   . Hypertension Paternal Aunt   . Atrial fibrillation Daughter    Allergies: No Known Allergies Medications: See  med rec.  Review of Systems: No fevers, chills, night sweats, weight loss, chest pain, or shortness of breath.   Objective:    General: Well Developed, well nourished, and in no acute distress.  Neuro: Alert and oriented x3.  HEENT: Normocephalic, atraumatic.  Skin: Warm and dry. Cardiac: Regular rate and rhythm, no murmurs rubs or gallops, no lower extremity edema.  Respiratory: Clear to auscultation bilaterally. Not using accessory muscles, speaking in full sentences. Abdominal: Mild suprapubic tenderness.  No CVA tenderness Pelvic exam: Vulva and labia intact without erythema or lesions.   Vaginal introitus pale, smooth.  Urethra visible, mild edema without erythema.  No vaginal or urethral discharge.  Speculum exam deferred.  No bladder or uterine prolapse noted.  Impression and Recommendations:    Urinary frequency/urgency POCT UA negative nitrites, leukocytes, positive trace lysed blood, specific gravity <= 1.005.  Sending for laboratory evaluation with microscopy and culture.  Suspect bladder spasms.  Myrbetriq too expensive with patient's insurance, trial Ditropan once daily. Holding off on further antibiotics pending urine evaluation.  Atrophic vaginitis Estrace cream intravaginally nightly.  Hypotension Monitor for development of any symptoms of low blood pressure including lightheadedness, dizziness, chest pain, palpitations, shortness of breath, or altered mental status.  If any of these develop please seek emergency care.  Return if symptoms worsen or fail to improve.  ___________________________________________ Clearnce Sorrel, DNP, APRN, FNP-BC [facility] and [place]

## 2019-12-31 NOTE — Telephone Encounter (Signed)
Pt aware via voicemail. Requested that she call us back and let us know if she would like for [place] to re-route the Rx for estradiol to [place].

## 2020-01-01 LAB — URINALYSIS, MICROSCOPIC ONLY
Bacteria, UA: NONE SEEN /HPF
Hyaline Cast: NONE SEEN /LPF
RBC / HPF: NONE SEEN /HPF (ref 0–2)
Squamous Epithelial / HPF: NONE SEEN /HPF (ref <=5)
WBC, UA: NONE SEEN /HPF (ref 0–5)

## 2020-01-01 LAB — URINE CULTURE
MICRO NUMBER:: 10092653
Result:: NO GROWTH
SPECIMEN QUALITY:: ADEQUATE

## 2020-01-01 NOTE — Telephone Encounter (Signed)
[place] (2nd attempt) and let us know if she would like for [place] to send the Rx for estradiol to [facility].

## 2020-01-04 NOTE — Telephone Encounter (Signed)
Pt states she picked up both Rx's at [facility] and has had some improvement since starting them. Advised pt to call back when she is due for a refill and let us know if she wants [place] to send it to a different pharmacy.

## 2020-04-11 NOTE — Progress Notes (Signed)
Subjective:    CC: possible UTI?  HPI: Pleasant 79 year old female presenting today with complaints of intermittent lower abdominal and suprapubic pressure, urinary frequency/urgency, and constipation for 1 week.  Denies fever, chills, burning with urination, back pain, or visible hematuria.  Reports she has had ongoing trouble with constipation, last episode 2 days ago.  Once she was able to pass the hard in stool, she reports experiencing diarrhea.  Endorses drinking at least 64 ounces of water a day in addition to coffee and tea.  Drinks most of her liquids in the afternoon/evening but does try to cut off drinking fluids around 7 PM.  I reviewed the past medical history, family history, social history, surgical history, and allergies today and no changes were needed.  Please see the problem list section below in epic for further details.  Past Medical History: No past medical history on file. Past Surgical History: Past Surgical History:  Procedure Laterality Date  . CHOLECYSTECTOMY     Social History: Social History   Socioeconomic History  . Marital status: Widowed    Spouse name: Not on file  . Number of children: 3  . Years of education: 62  . Highest education level: 12th grade  Occupational History  . Occupation: retired    Comment: housekeeper  Tobacco Use  . Smoking status: Former Research scientist (life sciences)  . Smokeless tobacco: Never Used  . Tobacco comment: quit 50 years ago  Substance and Sexual Activity  . Alcohol use: No    Alcohol/week: 0.0 standard drinks    Comment: Occasional  . Drug use: No  . Sexual activity: Not Currently    Birth control/protection: None, Post-menopausal  Other Topics Concern  . Not on file  Social History Narrative   Does [place] [employer] 2 times a week. Plays cards with friends during the week. Helps take  Care of daughters horse 2 times a week. Very active. Does a lot of games, reading. Caffeine minimal use. Daughter moving in with her next week from  [place] and will be living with her   Social Determinants of Health   Financial Resource Strain:   . Difficulty of Paying Living Expenses:   Food Insecurity:   . Worried About Charity fundraiser in the Last Year:   . Arboriculturist in the Last Year:   Transportation Needs:   . Film/video editor (Medical):   Marland Kitchen Lack of Transportation (Non-Medical):   Physical Activity:   . Days of Exercise per Week:   . Minutes of Exercise per Session:   Stress:   . Feeling of Stress :   Social Connections:   . Frequency of Communication with Friends and Family:   . Frequency of Social Gatherings with Friends and Family:   . Attends Religious Services:   . Active Member of Clubs or Organizations:   . Attends Archivist Meetings:   Marland Kitchen Marital Status:    Family History: Family History  Problem Relation Age of Onset  . Depression Father   . Suicidality Father   . Breast cancer Mother 51  . Hypertension Son   . Hypertension Paternal Aunt   . Atrial fibrillation Daughter    Allergies: No Known Allergies Medications: See med rec.  Review of Systems: See HPI for pertinent positives or negatives.  Objective:    General: Well Developed, well nourished, and in no acute distress.  Neuro: Alert and oriented x3.  HEENT: Normocephalic, atraumatic.  Skin: Warm and dry. Cardiac: Regular  rate and rhythm, no murmurs rubs or gallops, no lower extremity edema.  Respiratory: Clear to auscultation bilaterally. Not using accessory muscles, speaking in full sentences. Abdomen: Soft, nondistended, mildly tender to bilateral lower quadrants. BS + x 4. No HSM. No CVA tenderness.  Impression and Recommendations:    1. Urinary frequency POCT UA negative for nitrites and leukocytes, positive for small blood.  Findings similar to previous UA results.  Recommend spreading out water consumption throughout the day instead of mainly drinking in the afternoons.  This may help regulate her urinary  frequency. - POCT URINALYSIS DIP (CLINITEK)  2. Generalized abdominal pain POCT UA as above. - POCT URINALYSIS DIP (CLINITEK)  3. Encounter for screening mammogram for malignant neoplasm of breast Due for mammogram.  Order entered and patient will schedule at her convenience. - MM 3D SCREEN BREAST BILATERAL; Future  4. Constipation, unspecified constipation type Suspect constipation is causing her lower abdominal pressure and urinary concerns.  Discussed constipation in relation to diarrhea that she is experiencing.  Recommend starting MiraLAX 17 g daily as needed in at least 4 to 8 ounces of fluid.  Patient information/education on constipation included with AVS. - polyethylene glycol powder (MIRALAX) 17 GM/SCOOP powder; Take 17 g by mouth daily. 17g = 1 capful  Dispense: 255 g; Refill: 2  Return if symptoms worsen or fail to improve. ___________________________________________ Clearnce Sorrel, DNP, APRN, FNP-BC Primary Care and [place]

## 2020-04-12 ENCOUNTER — Encounter: Payer: Self-pay | Admitting: Medical-Surgical

## 2020-04-12 ENCOUNTER — Other Ambulatory Visit: Payer: Self-pay

## 2020-04-12 ENCOUNTER — Ambulatory Visit (INDEPENDENT_AMBULATORY_CARE_PROVIDER_SITE_OTHER): Payer: Medicare Other | Admitting: Medical-Surgical

## 2020-04-12 VITALS — BP 118/73 | HR 77 | Temp 97.5°F | Ht 68.0 in | Wt 164.0 lb

## 2020-04-12 DIAGNOSIS — Z1231 Encounter for screening mammogram for malignant neoplasm of breast: Secondary | ICD-10-CM

## 2020-04-12 DIAGNOSIS — K5900 Constipation, unspecified: Secondary | ICD-10-CM | POA: Diagnosis not present

## 2020-04-12 DIAGNOSIS — R350 Frequency of micturition: Secondary | ICD-10-CM | POA: Diagnosis not present

## 2020-04-12 DIAGNOSIS — R300 Dysuria: Secondary | ICD-10-CM

## 2020-04-12 DIAGNOSIS — R1084 Generalized abdominal pain: Secondary | ICD-10-CM | POA: Diagnosis not present

## 2020-04-12 LAB — POCT URINALYSIS DIP (CLINITEK)
Bilirubin, UA: NEGATIVE
Glucose, UA: NEGATIVE mg/dL
Ketones, POC UA: NEGATIVE mg/dL
Leukocytes, UA: NEGATIVE
Nitrite, UA: NEGATIVE
POC PROTEIN,UA: NEGATIVE
Spec Grav, UA: 1.01 (ref 1.010–1.025)
Urobilinogen, UA: 0.2 E.U./dL
pH, UA: 5.5 (ref 5.0–8.0)

## 2020-04-12 MED ORDER — POLYETHYLENE GLYCOL 3350 17 GM/SCOOP PO POWD: 17.0000 g | Freq: Every day | ORAL | 2 refills | Status: DC

## 2020-04-12 NOTE — Patient Instructions (Signed)

## 2020-04-13 ENCOUNTER — Ambulatory Visit (INDEPENDENT_AMBULATORY_CARE_PROVIDER_SITE_OTHER): Payer: Medicare Other

## 2020-04-13 DIAGNOSIS — Z1231 Encounter for screening mammogram for malignant neoplasm of breast: Secondary | ICD-10-CM

## 2020-05-28 IMAGING — DX DG CHEST 2V
2 series · 2 of 2 positions shown · non-contrast
Comparison: 12/10/2018

CLINICAL DATA: Pt c/o lingering cough, but states cough is much
better than it was, denies any new or worsening symptoms pneumonia
follow up

EXAM:
CHEST - 2 VIEW

[chest pa]
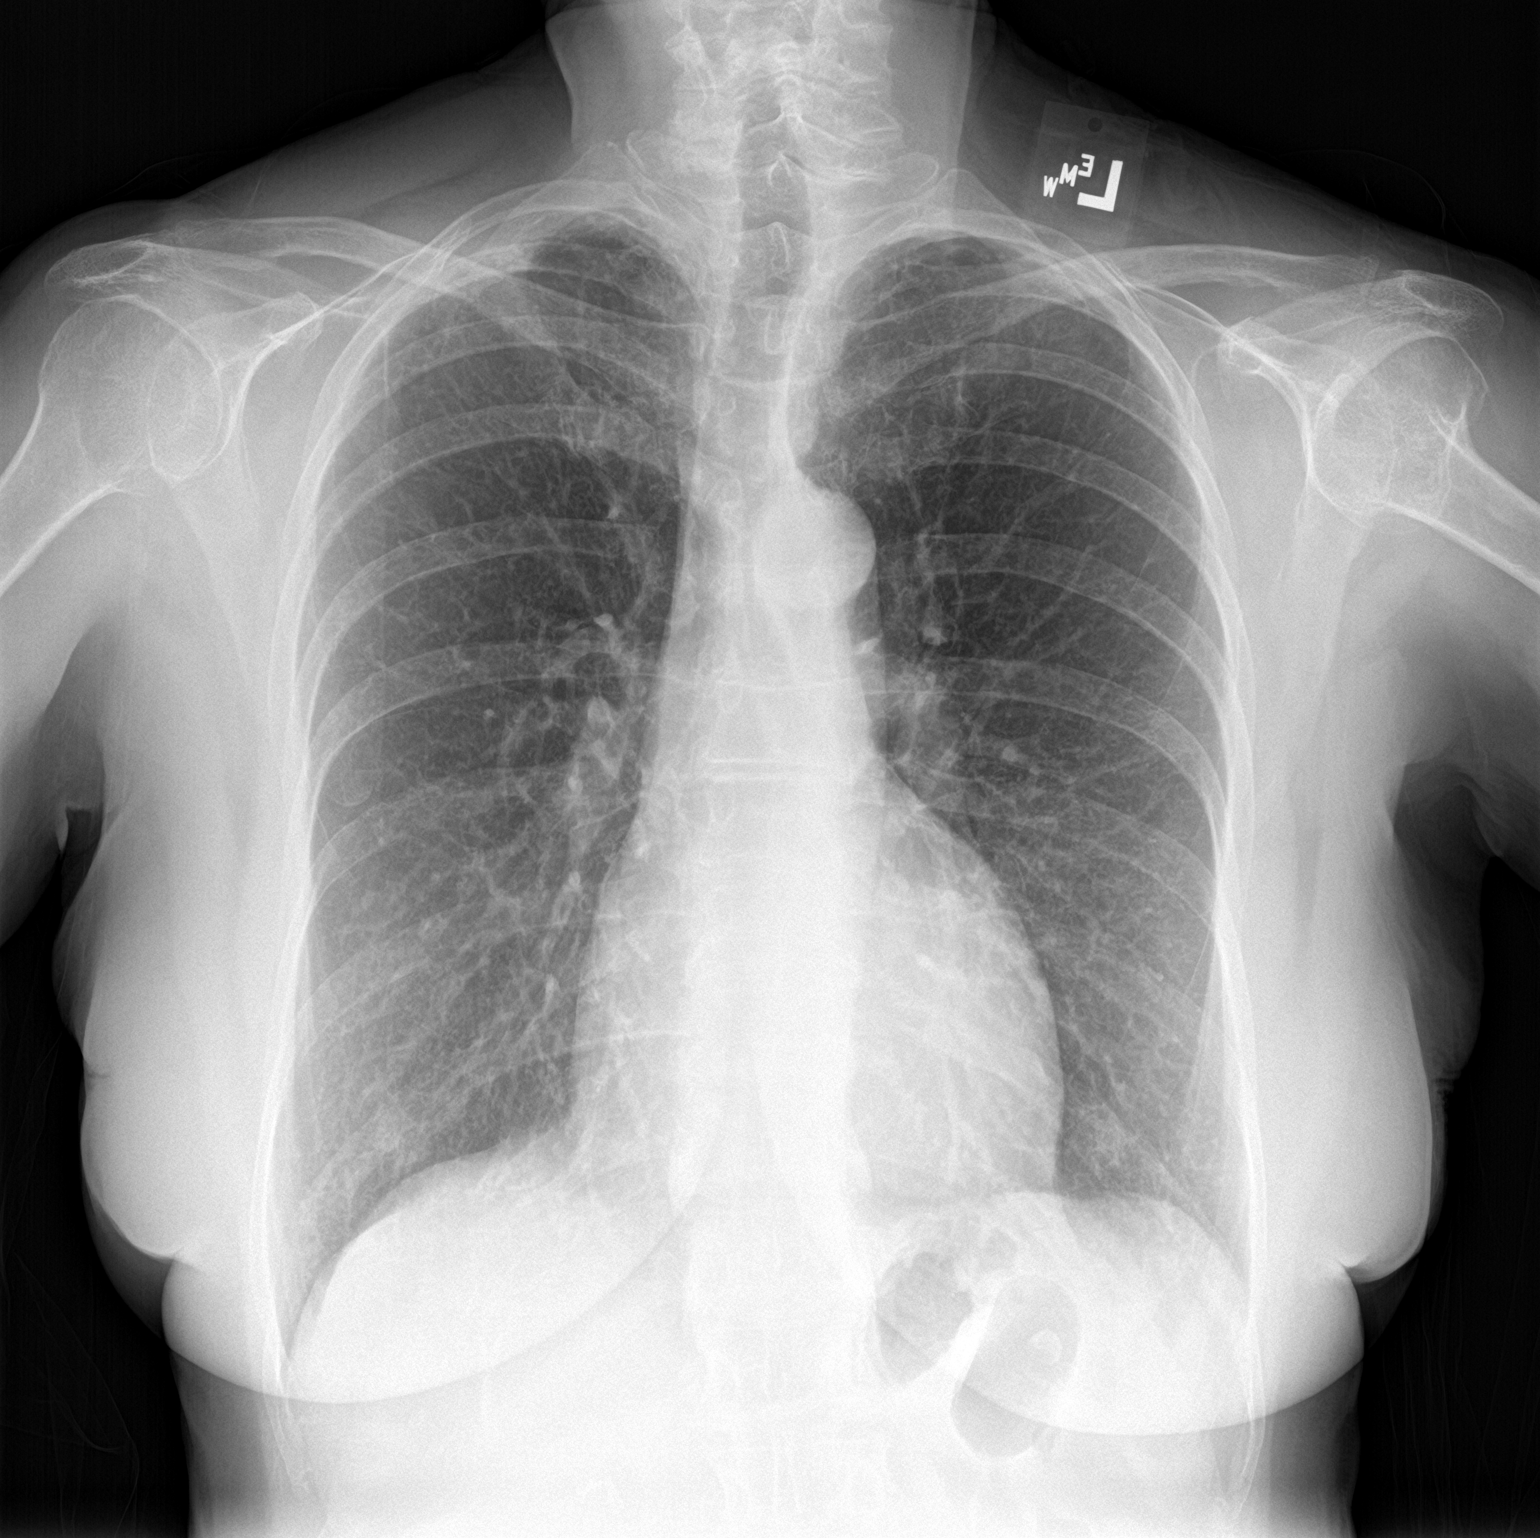

[chest lat]
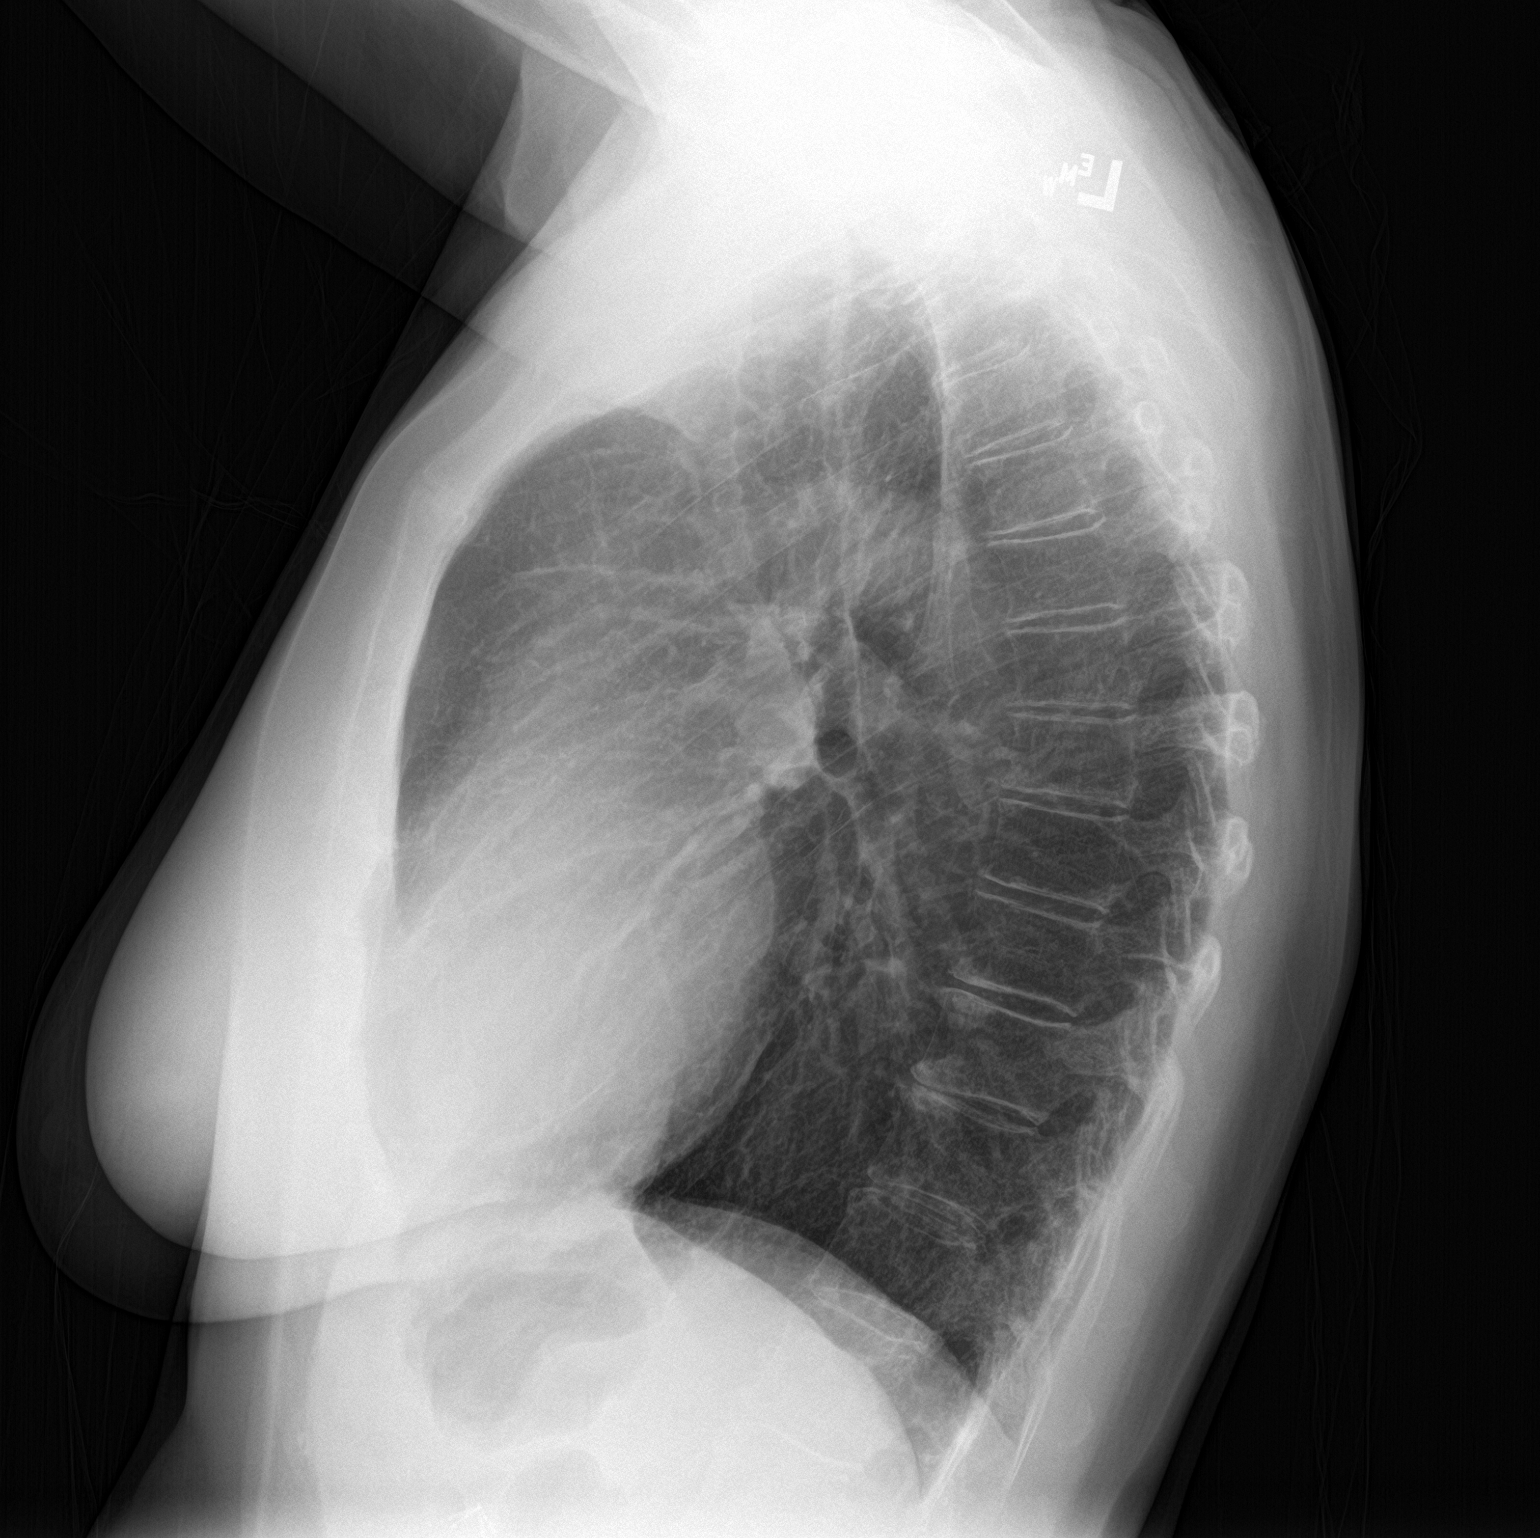

[2 of 2 positions shown; findings below may reference images not displayed]

FINDINGS: Normal mediastinum and cardiac silhouette. Normal pulmonary
vasculature. No evidence of effusion, infiltrate, or pneumothorax.
No acute bony abnormality.. Nipple shadows noted.
IMPRESSION: No acute cardiopulmonary process.

## 2020-06-23 ENCOUNTER — Telehealth: Payer: Self-pay | Admitting: Neurology

## 2020-06-23 NOTE — Telephone Encounter (Signed)
Received note from [employer] that patient may not be taking Pravastatin. LMOM for patient to call back to see if she is taking medication and if not to see if she was having issues with it.

## 2020-06-24 NOTE — Telephone Encounter (Signed)
Spoke with patient. She states she is still taking medication. No problems at all with it. She gets it shipped directly to her.

## 2020-06-24 NOTE — Telephone Encounter (Signed)
Patient called back and left vm

## 2020-07-08 ENCOUNTER — Other Ambulatory Visit: Payer: Self-pay | Admitting: Neurology

## 2020-07-08 MED ORDER — PRAVASTATIN SODIUM 40 MG PO TABS: 40.0000 mg | ORAL_TABLET | Freq: Every day | ORAL | 0 refills | Status: DC

## 2020-09-08 ENCOUNTER — Other Ambulatory Visit: Payer: Self-pay | Admitting: Neurology

## 2020-09-08 MED ORDER — PRAVASTATIN SODIUM 40 MG PO TABS: 40.0000 mg | ORAL_TABLET | Freq: Every day | ORAL | 0 refills | Status: DC

## 2020-11-07 ENCOUNTER — Ambulatory Visit (INDEPENDENT_AMBULATORY_CARE_PROVIDER_SITE_OTHER): Payer: Medicare Other | Admitting: Physician Assistant

## 2020-11-07 DIAGNOSIS — Z23 Encounter for immunization: Secondary | ICD-10-CM | POA: Diagnosis not present

## 2020-11-07 NOTE — Progress Notes (Signed)
Agree with plan 

## 2020-11-21 ENCOUNTER — Other Ambulatory Visit: Payer: Self-pay | Admitting: Neurology

## 2020-11-21 ENCOUNTER — Telehealth: Payer: Self-pay | Admitting: Physician Assistant

## 2020-11-21 MED ORDER — PRAVASTATIN SODIUM 40 MG PO TABS: 40.0000 mg | ORAL_TABLET | Freq: Every day | ORAL | 0 refills | Status: DC

## 2020-11-21 NOTE — Telephone Encounter (Signed)
Patient left vm that she wants pharmacy changed to [facility] and 90 day Pravastatin RX sent. Called patient and made her aware will send 1 month but she is overdue for labs and appt. Transferred to front desk to schedule.

## 2020-11-22 ENCOUNTER — Other Ambulatory Visit: Payer: Self-pay | Admitting: Neurology

## 2020-11-22 ENCOUNTER — Telehealth: Payer: Self-pay | Admitting: General Practice

## 2020-11-22 DIAGNOSIS — E559 Vitamin D deficiency, unspecified: Secondary | ICD-10-CM

## 2020-11-22 DIAGNOSIS — Z0000 Encounter for general adult medical examination without abnormal findings: Secondary | ICD-10-CM

## 2020-11-22 DIAGNOSIS — E782 Mixed hyperlipidemia: Secondary | ICD-10-CM

## 2020-11-22 DIAGNOSIS — Z1159 Encounter for screening for other viral diseases: Secondary | ICD-10-CM

## 2020-11-22 DIAGNOSIS — Z131 Encounter for screening for diabetes mellitus: Secondary | ICD-10-CM

## 2020-11-22 DIAGNOSIS — M8580 Other specified disorders of bone density and structure, unspecified site: Secondary | ICD-10-CM

## 2020-11-22 DIAGNOSIS — Z1329 Encounter for screening for other suspected endocrine disorder: Secondary | ICD-10-CM

## 2020-11-23 ENCOUNTER — Other Ambulatory Visit: Payer: Self-pay

## 2020-11-23 ENCOUNTER — Ambulatory Visit (INDEPENDENT_AMBULATORY_CARE_PROVIDER_SITE_OTHER): Payer: Medicare Other | Admitting: Physician Assistant

## 2020-11-23 VITALS — BP 90/73 | HR 76 | Temp 98.4°F | Resp 16 | Ht 67.5 in | Wt 164.1 lb

## 2020-11-23 DIAGNOSIS — Z1329 Encounter for screening for other suspected endocrine disorder: Secondary | ICD-10-CM | POA: Diagnosis not present

## 2020-11-23 DIAGNOSIS — Z0000 Encounter for general adult medical examination without abnormal findings: Secondary | ICD-10-CM

## 2020-11-23 DIAGNOSIS — E559 Vitamin D deficiency, unspecified: Secondary | ICD-10-CM | POA: Diagnosis not present

## 2020-11-23 DIAGNOSIS — M8580 Other specified disorders of bone density and structure, unspecified site: Secondary | ICD-10-CM | POA: Diagnosis not present

## 2020-11-23 DIAGNOSIS — Z131 Encounter for screening for diabetes mellitus: Secondary | ICD-10-CM | POA: Diagnosis not present

## 2020-11-23 DIAGNOSIS — E782 Mixed hyperlipidemia: Secondary | ICD-10-CM | POA: Diagnosis not present

## 2020-11-23 DIAGNOSIS — Z1159 Encounter for screening for other viral diseases: Secondary | ICD-10-CM | POA: Diagnosis not present

## 2020-11-23 NOTE — Progress Notes (Signed)
Subjective:   Gabrielle Torres is a 79 y.o. female who presents for Medicare Annual (Subsequent) preventive examination.  Review of Systems    Reports eating 3 meals a day with one snack.   Cardiac Risk Factors include: advanced age (>81mn, >>3women);dyslipidemia     Objective:    Today's Vitals   11/23/20 0838  BP: 90/73  Pulse: 76  Resp: 16  Temp: 98.4 F (36.9 C)  TempSrc: Oral  SpO2: 98%  Weight: 164 lb 1.9 oz (74.4 kg)  Height: 5' 7.5" (1.715 m)   Body mass index is 25.33 kg/m.  Advanced Directives 11/23/2020 11/23/2019 11/17/2018 12/28/2015  Does Patient Have a Medical Advance Directive? No No No No  Would patient like information on creating a medical advance directive? No - Patient declined No - Patient declined No - Patient declined Yes - Educational materials given    Current Medications (verified) Outpatient Encounter Medications as of 11/23/2020  Medication Sig  . aspirin 81 MG tablet Take 81 mg by mouth daily.  . calcium-vitamin D (OSCAL WITH D) 500-200 MG-UNIT tablet Take 1 tablet by mouth.  . Omega-3 Fatty Acids (FISH OIL PO) Take 3,000 mg by mouth daily.  . pravastatin (PRAVACHOL) 40 MG tablet Take 1 tablet (40 mg total) by mouth daily. Needs appt/labs  . AMBULATORY NON FORMULARY MEDICATION shingrx 2 doses to prevent shingles. (Patient not taking: Reported on 11/23/2020)  . estradiol (ESTRACE VAGINAL) 0.1 MG/GM vaginal cream Apply 1 applicator full in the vagina nightly x 1 week then reduce to three times weekly. (Patient not taking: Reported on 11/23/2020)  . famotidine (PEPCID) 20 MG tablet Take 20 mg by mouth 2 (two) times daily. (Patient not taking: Reported on 11/23/2020)  . oxybutynin (DITROPAN XL) 5 MG 24 hr tablet Take 1 tablet (5 mg total) by mouth at bedtime. (Patient not taking: Reported on 11/23/2020)  . phenazopyridine (PYRIDIUM) 100 MG tablet Take 1 tablet (100 mg total) by mouth 3 (three) times daily as needed for pain. (Patient not taking:  Reported on 11/23/2020)  . polyethylene glycol powder (MIRALAX) 17 GM/SCOOP powder Take 17 g by mouth daily. 17g = 1 capful (Patient not taking: Reported on 11/23/2020)   No facility-administered encounter medications on file as of 11/23/2020.    Allergies (verified) Patient has no known allergies.   History: Past Medical History:  Diagnosis Date  . Hyperlipidemia    Past Surgical History:  Procedure Laterality Date  . CHOLECYSTECTOMY     Family History  Problem Relation Age of Onset  . Depression Father   . Suicidality Father   . Breast cancer Mother 540 . Hypertension Son   . Hypertension Paternal Aunt   . Atrial fibrillation Daughter    Social History   Socioeconomic History  . Marital status: Widowed    Spouse name: Not on file  . Number of children: 3  . Years of education: 164 . Highest education level: 12th grade  Occupational History  . Occupation: retired    Comment: housekeeper  Tobacco Use  . Smoking status: Former SResearch scientist (life sciences) . Smokeless tobacco: Never Used  . Tobacco comment: quit 50 years ago  Vaping Use  . Vaping Use: Never used  Substance and Sexual Activity  . Alcohol use: Yes    Alcohol/week: 0.0 standard drinks    Comment: Occasional  . Drug use: No  . Sexual activity: Not Currently    Birth control/protection: None, Post-menopausal  Other Topics Concern  . Not  on file  Social History Narrative   Does [place] [employer] 2 times a week. Plays cards with friends during the week. Helps take Care of daughters horse 2 times a week. Very active. Does a lot of games, reading. Caffeine minimal use. Lives with her daughter.    Social Determinants of Health   Financial Resource Strain: Low Risk   . Difficulty of Paying Living Expenses: Not hard at all  Food Insecurity: No Food Insecurity  . Worried About Charity fundraiser in the Last Year: Never true  . Ran Out of Food in the Last Year: Never true  Transportation Needs: No Transportation Needs  . Lack of  Transportation (Medical): No  . Lack of Transportation (Non-Medical): No  Physical Activity: Insufficiently Active  . Days of Exercise per Week: 2 days  . Minutes of Exercise per Session: 20 min  Stress: No Stress Concern Present  . Feeling of Stress : Not at all  Social Connections: Moderately Isolated  . Frequency of Communication with Friends and Family: More than three times a week  . Frequency of Social Gatherings with Friends and Family: More than three times a week  . Attends Religious Services: Never  . Active Member of Clubs or Organizations: Yes  . Attends Archivist Meetings: Never  . Marital Status: Widowed    Tobacco Counseling Counseling given: Not Answered Comment: quit 50 years ago   Clinical Intake:  Pre-visit preparation completed: Yes  Pain : No/denies pain     BMI - recorded: 25.33 Nutritional Status: BMI 25 -29 Overweight Nutritional Risks: None Diabetes: No  How often do you need to have someone help you when you read instructions, pamphlets, or other written materials from your doctor or pharmacy?: 1 - Never What is the last grade level you completed in school?: High school diploma  Diabetic? NO  Interpreter Needed?: No  Information entered by :: Tinnie Gens, RN   Activities of Daily Living In your present state of health, do you have any difficulty performing the following activities: 11/23/2020  Hearing? Y  Comment in a crowd environment  Vision? N  Difficulty concentrating or making decisions? N  Walking or climbing stairs? N  Dressing or bathing? N  Doing errands, shopping? N  Preparing Food and eating ? N  Using the Toilet? N  In the past six months, have you accidently leaked urine? N  Do you have problems with loss of bowel control? Y  Managing your Medications? N  Managing your Finances? N  Housekeeping or managing your Housekeeping? N  Some recent data might be hidden    Patient Care Team: Lavada Mesi as PCP - General (Family Medicine)  Indicate any recent Medical Services you may have received from other than [facility] providers in the past year (date may be approximate).     Assessment:   This is a routine wellness examination for Gabrielle Torres.  Hearing/Vision screen No exam data present  Dietary issues and exercise activities discussed: Current Exercise Habits: Home exercise routine, Type of exercise: Other - see comments (Tai Chi), Time (Minutes): 20, Frequency (Times/Week): 3, Weekly Exercise (Minutes/Week): 60, Intensity: Moderate, Exercise limited by: None identified  Goals    . Patient Stated     Patient stated she wanted to add more fruits and vegetables into her diet and exercise more for longer periods of time.      Depression Screen PHQ 2/9 Scores 11/23/2020 11/23/2020 11/23/2019 02/03/2019 01/13/2019 11/17/2018 07/14/2018  PHQ -  2 Score 0 0 0 0 0 0 0  PHQ- 9 Score - - - 0 - - 0    Fall Risk Fall Risk  11/23/2020 11/23/2019 10/28/2019 11/17/2018 10/22/2018  Falls in the past year? 0 1 0 0 0  Comment - - Emmi Telephone Survey: data to providers prior to load - Emmi Telephone Survey: data to providers prior to load  Number falls in past yr: 0 0 - - -  Injury with Fall? 0 1 - - -  Comment - cracked rib from fall in kitchen - - -  Risk for fall due to : No Fall Risks - - - -  Follow up Falls evaluation completed Falls prevention discussed - - -    FALL RISK PREVENTION PERTAINING TO THE HOME:  Any stairs in or around the home? No  If so, are there any without handrails? No  Home free of loose throw rugs in walkways, pet beds, electrical cords, etc? No Adequate lighting in your home to reduce risk of falls? Yes    Cognitive Function:     6CIT Screen 11/23/2020 11/23/2019 11/17/2018  What Year? 0 points 0 points 0 points  What month? 0 points 0 points 0 points  What time? 0 points 0 points 0 points  Count back from 20 0 points 0 points 0 points  Months in reverse 2  points 0 points 2 points  Repeat phrase 2 points 0 points 0 points  Total Score 4 0 2    Immunizations Immunization History  Administered Date(s) Administered  . Fluad Quad(high Dose 65+) 09/14/2019, 11/07/2020  . Influenza, High Dose Seasonal PF 12/31/2017  . Influenza,inj,Quad PF,6+ Mos 01/09/2017, 11/17/2018  . PFIZER SARS-COV-2 Vaccination 01/28/2020, 02/23/2020  . Pneumococcal Conjugate-13 12/28/2015  . Pneumococcal Polysaccharide-23 01/09/2017  . Tdap 12/28/2015, 11/24/2017    TDAP status: Up to date  Flu Vaccine status: Up to date  Pneumococcal vaccine status: Up to date  Covid-19 vaccine status: Information provided on how to obtain vaccines. Patient has had the first two vaccines and is due for a booster. Educated the patient about the importance of the booster and provided information on where the patient can get the booster.  Qualifies for Shingles Vaccine? Yes   Zostavax completed No   Shingrix Completed?: No.    Education has been provided regarding the importance of this vaccine. Patient has been advised to call insurance company to determine out of pocket expense if they have not yet received this vaccine. Advised may also receive vaccine at local pharmacy or Health Dept. Verbalized acceptance and understanding.  Screening Tests Health Maintenance  Topic Date Due  . Hepatitis C Screening  11/28/2020 (Originally 06-29-1941)  . COVID-19 Vaccine (3 - Booster for Pfizer series) 12/30/2020 (Originally 08/25/2020)  . MAMMOGRAM  04/13/2022  . COLONOSCOPY  07/01/2022  . TETANUS/TDAP  11/25/2027  . INFLUENZA VACCINE  Completed  . DEXA SCAN  Completed  . PNA vac Low Risk Adult  Completed    Health Maintenance  There are no preventive care reminders to display for this patient.  Colorectal cancer screening: Type of screening: Colonoscopy. Completed 2020. Repeat every 3 years  Mammogram status: Completed 2021. Repeat every year   Bone Density status: Completed 2020.  Results reflect: Bone density results: NORMAL. Repeat every 2 years.  Lung Cancer Screening: (Low Dose CT Chest recommended if Age 67-80 years, 30 pack-year currently smoking OR have quit w/in 15years.) does not qualify.   Lung Cancer Screening Referral: no  Additional Screening:  Hepatitis C Screening: does qualify; Completed- patient has been given the order to have the screening completed.  Vision Screening: Recommended annual ophthalmology exams for early detection of glaucoma and other disorders of the eye. Is the patient up to date with their annual eye exam?  No  Who is the provider or what is the name of the office in which the patient attends annual eye exams? Last exam was two years ago at [facility] eye center If pt is not established with a provider, would they like to be referred to a provider to establish care? Yes . Given patient name and address of a local eye doctor.  Dental Screening: Recommended annual dental exams for proper oral hygiene  Community Resource Referral / Chronic Care Management: CRR required this visit?  No   CCM required this visit?  No      Plan:     I have personally reviewed and noted the following in the patient's chart:   . Medical and social history . Use of alcohol, tobacco or illicit drugs  . Current medications and supplements . Functional ability and status . Nutritional status . Physical activity . Advanced directives . List of other physicians . Hospitalizations, surgeries, and ER visits in previous 12 months . Vitals . Screenings to include cognitive, depression, and falls . Referrals and appointments  In addition, I have reviewed and discussed with patient certain preventive protocols, quality metrics, and best practice recommendations. A written personalized care plan for preventive services as well as general preventive health recommendations were provided to patient.     Tinnie Gens, RN   11/23/2020

## 2020-11-23 NOTE — Patient Instructions (Signed)
Fat and Cholesterol Restricted Eating Plan Eating a diet that limits fat and cholesterol may help lower your risk for heart disease and other conditions. Your body needs fat and cholesterol for basic functions, but eating too much of these things can be harmful to your health. Your health care provider may order lab tests to check your blood fat (lipid) and cholesterol levels. This helps your health care provider understand your risk for certain conditions and whether you need to make diet changes. Work with your health care provider or dietitian to make an eating plan that is right for you. Your plan includes:  Limit your fat intake to ______% or less of your total calories a day.  Limit your saturated fat intake to ______% or less of your total calories a day.  Limit the amount of cholesterol in your diet to less than _________mg a day.  Eat ___________ g of fiber a day. What are tips for following this plan? General guidelines   If you are overweight, work with your health care provider to lose weight safely. Losing just 5-10% of your body weight can improve your overall health and help prevent diseases such as diabetes and heart disease.  Avoid: ? Foods with added sugar. ? Fried foods. ? Foods that contain partially hydrogenated oils, including stick margarine, some tub margarines, cookies, crackers, and other baked goods.  Limit alcohol intake to no more than 1 drink a day for nonpregnant women and 2 drinks a day for men. One drink equals 12 oz of beer, 5 oz of wine, or 1 oz of hard liquor. Reading food labels  Check food labels for: ? Trans fats, partially hydrogenated oils, or high amounts of saturated fat. Avoid foods that contain saturated fat and trans fat. ? The amount of cholesterol in each serving. Try to eat no more than 200 mg of cholesterol each day. ? The amount of fiber in each serving. Try to eat at least 20-30 g of fiber each day.  Choose foods with healthy  fats, such as: ? Monounsaturated and polyunsaturated fats. These include olive and canola oil, flaxseeds, walnuts, almonds, and seeds. ? Omega-3 fats. These are found in foods such as salmon, mackerel, sardines, tuna, flaxseed oil, and ground flaxseeds.  Choose grain products that have whole grains. Look for the word "whole" as the first word in the ingredient list. Cooking  Cook foods using methods other than frying. Baking, boiling, grilling, and broiling are some healthy options.  Eat more home-cooked food and less restaurant, buffet, and fast food.  Avoid cooking using saturated fats. ? Animal sources of saturated fats include meats, butter, and cream. ? Plant sources of saturated fats include palm oil, palm kernel oil, and coconut oil. Meal planning   At meals, imagine dividing your plate into fourths: ? Fill one-half of your plate with vegetables and green salads. ? Fill one-fourth of your plate with whole grains. ? Fill one-fourth of your plate with lean protein foods.  Eat fish that is high in omega-3 fats at least two times a week.  Eat more foods that contain fiber, such as whole grains, beans, apples, broccoli, carrots, peas, and barley. These foods help promote healthy cholesterol levels in the blood. Recommended foods Grains  Whole grains, such as whole wheat or whole grain breads, crackers, cereals, and pasta. Unsweetened oatmeal, bulgur, barley, quinoa, or brown rice. Corn or whole wheat flour tortillas. Vegetables  Fresh or frozen vegetables (raw, steamed, roasted, or grilled). Green salads.  Fruits  All fresh, canned (in natural juice), or frozen fruits. Meats and other protein foods  Ground beef (85% or leaner), grass-fed beef, or beef trimmed of fat. Skinless chicken or [place]. Ground chicken or [place]. Pork trimmed of fat. All fish and seafood. Egg whites. Dried beans, peas, or lentils. Unsalted nuts or seeds. Unsalted canned beans. Natural nut butters without  added sugar and oil. Dairy  Low-fat or nonfat dairy products, such as skim or 1% milk, 2% or reduced-fat cheeses, low-fat and fat-free ricotta or cottage cheese, or plain low-fat and nonfat yogurt. Fats and oils  Tub margarine without trans fats. Light or reduced-fat mayonnaise and salad dressings. Avocado. Olive, canola, sesame, or safflower oils. The items listed above may not be a complete list of recommended foods or beverages. Contact your dietitian for more options. Foods to avoid Grains  White bread. White pasta. White rice. Cornbread. Bagels, pastries, and croissants. Crackers and snack foods that contain trans fat and hydrogenated oils. Vegetables  Vegetables cooked in cheese, cream, or butter sauce. Fried vegetables. Fruits  Canned fruit in heavy syrup. Fruit in cream or butter sauce. Fried fruit. Meats and other protein foods  Fatty cuts of meat. Ribs, chicken wings, bacon, sausage, bologna, salami, chitterlings, fatback, hot dogs, bratwurst, and packaged lunch meats. Liver and organ meats. Whole eggs and egg yolks. Chicken and [place] with skin. Fried meat. Dairy  Whole or 2% milk, cream, half-and-half, and cream cheese. Whole milk cheeses. Whole-fat or sweetened yogurt. Full-fat cheeses. Nondairy creamers and whipped toppings. Processed cheese, cheese spreads, and cheese curds. Beverages  Alcohol. Sugar-sweetened drinks such as sodas, lemonade, and fruit drinks. Fats and oils  Butter, stick margarine, lard, shortening, ghee, or bacon fat. Coconut, palm kernel, and palm oils. Sweets and desserts  Corn syrup, sugars, honey, and molasses. Candy. Jam and jelly. Syrup. Sweetened cereals. Cookies, pies, cakes, donuts, muffins, and ice cream. The items listed above may not be a complete list of foods and beverages to avoid. Contact your dietitian for more information. Summary  Your body needs fat and cholesterol for basic functions. However, eating too much of these things  can be harmful to your health.  Work with your health care provider and dietitian to follow a diet low in fat and cholesterol. Doing this may help lower your risk for heart disease and other conditions.  Choose healthy fats, such as monounsaturated and polyunsaturated fats, and foods high in omega-3 fatty acids.  Eat fiber-rich foods, such as whole grains, beans, peas, fruits, and vegetables.  Limit or avoid alcohol, fried foods, and foods high in saturated fats, partially hydrogenated oils, and sugar. This information is not intended to replace advice given to you by your health care provider. Make sure you discuss any questions you have with your health care provider. Document Revised: 11/01/2017 Document Reviewed: 08/06/2017 Elsevier Patient Education  2020 [place] Maintenance, Female Adopting a healthy lifestyle and getting preventive care are important in promoting health and wellness. Ask your health care provider about:  The right schedule for you to have regular tests and exams.  Things you can do on your own to prevent diseases and keep yourself healthy. What should I know about diet, weight, and exercise? Eat a healthy diet   Eat a diet that includes plenty of vegetables, fruits, low-fat dairy products, and lean protein.  Do not eat a lot of foods that are high in solid fats, added sugars, or sodium. Maintain a healthy weight Body  mass index (BMI) is used to identify weight problems. It estimates body fat based on height and weight. Your health care provider can help determine your BMI and help you achieve or maintain a healthy weight. Get regular exercise Get regular exercise. This is one of the most important things you can do for your health. Most adults should:  Exercise for at least 150 minutes each week. The exercise should increase your heart rate and make you sweat (moderate-intensity exercise).  Do strengthening exercises at least twice a week.  This is in addition to the moderate-intensity exercise.  Spend less time sitting. Even light physical activity can be beneficial. Watch cholesterol and blood lipids Have your blood tested for lipids and cholesterol at 77 years of age, then have this test every 5 years. Have your cholesterol levels checked more often if:  Your lipid or cholesterol levels are high.  You are older than 77 years of age.  You are at high risk for heart disease. What should I know about cancer screening? Depending on your health history and family history, you may need to have cancer screening at various ages. This may include screening for:  Breast cancer.  Cervical cancer.  Colorectal cancer.  Skin cancer.  Lung cancer. What should I know about heart disease, diabetes, and high blood pressure? Blood pressure and heart disease  High blood pressure causes heart disease and increases the risk of stroke. This is more likely to develop in people who have high blood pressure readings, are of African descent, or are overweight.  Have your blood pressure checked: ? Every 3-5 years if you are 77-31 years of age. ? Every year if you are 46 years old or older. Diabetes Have regular diabetes screenings. This checks your fasting blood sugar level. Have the screening done:  Once every three years after age 19 if you are at a normal weight and have a low risk for diabetes.  More often and at a younger age if you are overweight or have a high risk for diabetes. What should I know about preventing infection? Hepatitis B If you have a higher risk for hepatitis B, you should be screened for this virus. Talk with your health care provider to find out if you are at risk for hepatitis B infection. Hepatitis C Testing is recommended for:  Everyone born from [birth year] through 1963.  Anyone with known risk factors for hepatitis C. Sexually transmitted infections (STIs)  Get screened for STIs, including gonorrhea and  chlamydia, if: ? You are sexually active and are younger than 77 years of age. ? You are older than 77 years of age and your health care provider tells you that you are at risk for this type of infection. ? Your sexual activity has changed since you were last screened, and you are at increased risk for chlamydia or gonorrhea. Ask your health care provider if you are at risk.  Ask your health care provider about whether you are at high risk for HIV. Your health care provider may recommend a prescription medicine to help prevent HIV infection. If you choose to take medicine to prevent HIV, you should first get tested for HIV. You should then be tested every 3 months for as long as you are taking the medicine. Pregnancy  If you are about to stop having your period (premenopausal) and you may become pregnant, seek counseling before you get pregnant.  Take 400 to 800 micrograms (mcg) of folic acid every day if you  become pregnant.  Ask for birth control (contraception) if you want to prevent pregnancy. Osteoporosis and menopause Osteoporosis is a disease in which the bones lose minerals and strength with aging. This can result in bone fractures. If you are 75 years old or older, or if you are at risk for osteoporosis and fractures, ask your health care provider if you should:  Be screened for bone loss.  Take a calcium or vitamin D supplement to lower your risk of fractures.  Be given hormone replacement therapy (HRT) to treat symptoms of menopause. Follow these instructions at home: Lifestyle  Do not use any products that contain nicotine or tobacco, such as cigarettes, e-cigarettes, and chewing tobacco. If you need help quitting, ask your health care provider.  Do not use street drugs.  Do not share needles.  Ask your health care provider for help if you need support or information about quitting drugs. Alcohol use  Do not drink alcohol if: ? Your health care provider tells you not to  drink. ? You are pregnant, may be pregnant, or are planning to become pregnant.  If you drink alcohol: ? Limit how much you use to 0-1 drink a day. ? Limit intake if you are breastfeeding.  Be aware of how much alcohol is in your drink. In the [place] one drink equals one 12 oz bottle of beer (355 mL), one 5 oz glass of wine (148 mL), or one 1 oz glass of hard liquor (44 mL). General instructions  Schedule regular health, dental, and eye exams.  Stay current with your vaccines.  Tell your health care provider if: ? You often feel depressed. ? You have ever been abused or do not feel safe at home. Summary  Adopting a healthy lifestyle and getting preventive care are important in promoting health and wellness.  Follow your health care provider's instructions about healthy diet, exercising, and getting tested or screened for diseases.  Follow your health care provider's instructions on monitoring your cholesterol and blood pressure. This information is not intended to replace advice given to you by your health care provider. Make sure you discuss any questions you have with your health care provider. Document Revised: 11/12/2018 Document Reviewed: 11/12/2018 Elsevier Patient Education  2020 [employer].

## 2020-11-24 LAB — LIPID PANEL W/REFLEX DIRECT LDL
Cholesterol: 188 mg/dL (ref <200)
HDL: 82 mg/dL (ref >=50)
LDL Cholesterol (Calc): 83 mg/dL (calc)
Non-HDL Cholesterol (Calc): 106 mg/dL (calc) (ref <130)
Total CHOL/HDL Ratio: 2.3 (calc) (ref <5.0)
Triglycerides: 135 mg/dL (ref <150)

## 2020-11-24 LAB — COMPLETE METABOLIC PANEL WITH GFR
AG Ratio: 1.7 (calc) (ref 1.0–2.5)
ALT: 15 U/L (ref 6–29)
AST: 22 U/L (ref 10–35)
Albumin: 4.4 g/dL (ref 3.6–5.1)
Alkaline phosphatase (APISO): 93 U/L (ref 37–153)
BUN/Creatinine Ratio: 13 (calc) (ref 6–22)
BUN: 12 mg/dL (ref 7–25)
CO2: 27 mmol/L (ref 20–32)
Calcium: 9.7 mg/dL (ref 8.6–10.4)
Chloride: 106 mmol/L (ref 98–110)
Creat: 0.96 mg/dL — ABNORMAL HIGH (ref 0.60–0.93)
GFR, Est African American: 66 mL/min/{1.73_m2} (ref >=60)
GFR, Est Non African American: 57 mL/min/{1.73_m2} — ABNORMAL LOW (ref >=60)
Globulin: 2.6 g/dL (calc) (ref 1.9–3.7)
Glucose, Bld: 91 mg/dL (ref 65–99)
Potassium: 4.6 mmol/L (ref 3.5–5.3)
Sodium: 141 mmol/L (ref 135–146)
Total Bilirubin: 0.5 mg/dL (ref 0.2–1.2)
Total Protein: 7 g/dL (ref 6.1–8.1)

## 2020-11-24 LAB — CBC WITH DIFFERENTIAL/PLATELET
Absolute Monocytes: 388 cells/uL (ref 200–950)
Basophils Absolute: 8 cells/uL (ref 0–200)
Basophils Relative: 0.2 %
Eosinophils Absolute: 48 cells/uL (ref 15–500)
Eosinophils Relative: 1.2 %
HCT: 40 % (ref 35.0–45.0)
Hemoglobin: 13.6 g/dL (ref 11.7–15.5)
Lymphs Abs: 1064 cells/uL (ref 850–3900)
MCH: 31.4 pg (ref 27.0–33.0)
MCHC: 34 g/dL (ref 32.0–36.0)
MCV: 92.4 fL (ref 80.0–100.0)
MPV: 10.8 fL (ref 7.5–12.5)
Monocytes Relative: 9.7 %
Neutro Abs: 2492 cells/uL (ref 1500–7800)
Neutrophils Relative %: 62.3 %
Platelets: 223 10*3/uL (ref 140–400)
RBC: 4.33 10*6/uL (ref 3.80–5.10)
RDW: 12.8 % (ref 11.0–15.0)
Total Lymphocyte: 26.6 %
WBC: 4 10*3/uL (ref 3.8–10.8)

## 2020-11-24 LAB — TSH: TSH: 4.63 mIU/L — ABNORMAL HIGH (ref 0.40–4.50)

## 2020-11-24 LAB — HEPATITIS C ANTIBODY
Hepatitis C Ab: NONREACTIVE
SIGNAL TO CUT-OFF: 0.01 (ref <1.00)

## 2020-11-24 LAB — VITAMIN D 25 HYDROXY (VIT D DEFICIENCY, FRACTURES): Vit D, 25-Hydroxy: 29 ng/mL — ABNORMAL LOW (ref 30–100)

## 2020-11-28 NOTE — Progress Notes (Signed)
Gabrielle Torres,   Kidney function has improved a bit from 1 year ago. Great news.  Cholesterol looks great.  Vitamin D is low. How much vitamin D are you taking daily?  Your TSH is a little elevated meaning your active thyroid hormone is likely a little low. I would like to add T4 to this and see what your free levels are. If feeling a little fatigued and having hypothyroid symptoms could consider a small thyroid dose supplement.

## 2020-12-07 NOTE — Telephone Encounter (Signed)
Documentation

## 2020-12-14 ENCOUNTER — Other Ambulatory Visit: Payer: Self-pay | Admitting: Physician Assistant

## 2021-01-26 ENCOUNTER — Emergency Department (INDEPENDENT_AMBULATORY_CARE_PROVIDER_SITE_OTHER)
Admission: EM | Admit: 2021-01-26 | Discharge: 2021-01-26 | Disposition: A | Discharge status: Home / Self Care | Payer: Medicare Other | Source: Home / Self Care | Attending: Family Medicine | Admitting: Family Medicine

## 2021-01-26 ENCOUNTER — Other Ambulatory Visit: Payer: Self-pay

## 2021-01-26 ENCOUNTER — Encounter: Payer: Self-pay | Admitting: Emergency Medicine

## 2021-01-26 ENCOUNTER — Emergency Department (INDEPENDENT_AMBULATORY_CARE_PROVIDER_SITE_OTHER): Payer: Medicare Other

## 2021-01-26 DIAGNOSIS — M25562 Pain in left knee: Secondary | ICD-10-CM

## 2021-01-26 DIAGNOSIS — Y9375 Activity, martial arts: Secondary | ICD-10-CM

## 2021-01-26 DIAGNOSIS — M7989 Other specified soft tissue disorders: Secondary | ICD-10-CM | POA: Diagnosis not present

## 2021-01-26 DIAGNOSIS — S8992XA Unspecified injury of left lower leg, initial encounter: Secondary | ICD-10-CM | POA: Diagnosis not present

## 2021-01-26 DIAGNOSIS — S83412A Sprain of medial collateral ligament of left knee, initial encounter: Secondary | ICD-10-CM | POA: Diagnosis not present

## 2021-01-26 NOTE — ED Triage Notes (Signed)
Twisted left knee while doing tai chi today  Swelling to left knee noted Had pulled a muscle in her calf last week and has been treating it with biofreeze and it has been getting better Tylenol 679m at 1000- helped w/ pain  Increased pain w/ walking

## 2021-01-26 NOTE — Discharge Instructions (Addendum)
Apply ice pack for 30 minutes every 1 to 2 hours today and tomorrow.  Elevate.  Use a cane if you feel unstable.  Wear brace for about 2 weeks.  May take Tylenol as needed for pain. Begin range of motion and stretching exercises in about 5 days as per instruction sheet.

## 2021-01-26 NOTE — ED Provider Notes (Signed)
[name]    CSN: [id number] Arrival date & time: 01/26/21  1435      History   Chief Complaint Chief Complaint  Patient presents with  . Knee Pain    Left     HPI Gabrielle Torres is a 80 y.o. female.   While doing tai chi at about 9am today, patient twisted her left knee followed by pain/swelling.  She has had persistent pain with weight bearing.  The history is provided by the patient.  Knee Pain Location:  Knee Time since incident:  6 hours Injury: yes   Mechanism of injury comment:  Twisted knee Knee location:  L knee Pain details:    Quality:  Aching   Radiates to:  Does not radiate   Severity:  Moderate   Onset quality:  Sudden   Duration:  6 hours   Timing:  Constant   Progression:  Unchanged Chronicity:  New Prior injury to area:  No Relieved by:  Rest Worsened by:  Flexion, bearing weight and activity Ineffective treatments:  Ice Associated symptoms: decreased ROM, stiffness and swelling     Past Medical History:  Diagnosis Date  . Hyperlipidemia     Patient Active Problem List   Diagnosis Date Noted  . Urinary frequency 12/31/2019  . Urinary urgency 12/31/2019  . Atrophic vaginitis 12/31/2019  . Osteopenia 02/03/2019  . Age-related osteoporosis without current pathological fracture 01/13/2019  . Idiopathic hypotension 01/14/2018  . Itchy, watery, and red eye 01/14/2018  . Hypertriglyceridemia 01/09/2017  . Post-menopausal 05/09/2016  . Piriformis syndrome of right side 05/09/2016  . Adenomatous polyp of colon 05/07/2016  . Vitamin D deficiency 12/28/2015  . Hyperlipidemia 12/28/2015    Past Surgical History:  Procedure Laterality Date  . CHOLECYSTECTOMY      OB History    Gravida  3   Para      Term      Preterm      AB      Living  3     SAB      IAB      Ectopic      Multiple      Live Births  3            Home Medications    Prior to Admission medications   Medication Sig Start Date End Date  Taking? Authorizing Provider  aspirin 81 MG tablet Take 81 mg by mouth daily.   Yes [provider]  calcium-vitamin D (OSCAL WITH D) 500-200 MG-UNIT tablet Take 1 tablet by mouth.   Yes [provider]  Omega-3 Fatty Acids (FISH OIL PO) Take 3,000 mg by mouth daily.   Yes [provider]  pravastatin (PRAVACHOL) 40 MG tablet Take 1 tablet (40 mg total) by mouth daily. 12/14/20  Yes Breeback, Jade L, PA-C  AMBULATORY NON FORMULARY MEDICATION shingrx 2 doses to prevent shingles. Patient not taking: Reported on 11/23/2020 01/13/19   Donella Stade, PA-C  estradiol (ESTRACE VAGINAL) 0.1 MG/GM vaginal cream Apply 1 applicator full in the vagina nightly x 1 week then reduce to three times weekly. Patient not taking: Reported on 11/23/2020 12/31/19   Samuel Bouche, NP  famotidine (PEPCID) 20 MG tablet Take 20 mg by mouth 2 (two) times daily. Patient not taking: Reported on 11/23/2020    [provider]  oxybutynin (DITROPAN XL) 5 MG 24 hr tablet Take 1 tablet (5 mg total) by mouth at bedtime. Patient not taking: Reported on 11/23/2020 12/31/19  Samuel Bouche, NP  phenazopyridine (PYRIDIUM) 100 MG tablet Take 1 tablet (100 mg total) by mouth 3 (three) times daily as needed for pain. Patient not taking: Reported on 11/23/2020 12/17/19   Samuel Bouche, NP  polyethylene glycol powder (MIRALAX) 17 GM/SCOOP powder Take 17 g by mouth daily. 17g = 1 capful Patient not taking: Reported on 11/23/2020 04/12/20   Samuel Bouche, NP    Family History Family History  Problem Relation Age of Onset  . Depression Father   . Suicidality Father   . Breast cancer Mother 61  . Hypertension Son   . Hypertension Paternal Aunt   . Atrial fibrillation Daughter     Social History Social History   Tobacco Use  . Smoking status: Former Research scientist (life sciences)  . Smokeless tobacco: Never Used  . Tobacco comment: quit 50 years ago  Vaping Use  . Vaping Use: Never used  Substance Use Topics  . Alcohol use:  Yes    Alcohol/week: 0.0 standard drinks    Comment: Occasional  . Drug use: No     Allergies   Patient has no known allergies.   Review of Systems Review of Systems  Musculoskeletal: Positive for stiffness.  All other systems reviewed and are negative.    Physical Exam Triage Vital Signs ED Triage Vitals  Enc Vitals Group     BP 01/26/21 1450 133/80     Pulse Rate 01/26/21 1450 79     Resp 01/26/21 1450 16     Temp 01/26/21 1450 97.9 F (36.6 C)     Temp Source 01/26/21 1450 Oral     SpO2 01/26/21 1450 98 %     Weight 01/26/21 1452 163 lb 2.3 oz (74 kg)     Height 01/26/21 1452 5' 7.5" (1.715 m)     Head Circumference --      Peak Flow --      Pain Score 01/26/21 1452 3     Pain Loc --      Pain Edu? --      Excl. in [place]? --    No data found.  Updated Vital Signs BP 133/80 (BP Location: Right Arm)   Pulse 79   Temp 97.9 F (36.6 C) (Oral)   Resp 16   Ht 5' 7.5" (1.715 m)   Wt 74 kg   SpO2 98%   BMI 25.17 kg/m   Visual Acuity Right Eye Distance:   Left Eye Distance:   Bilateral Distance:    Right Eye Near:   Left Eye Near:    Bilateral Near:     Physical Exam Vitals and nursing note reviewed.  Constitutional:      General: She is not in acute distress. HENT:     Head: Normocephalic.  Cardiovascular:     Rate and Rhythm: Normal rate.  Pulmonary:     Effort: Pulmonary effort is normal.  Musculoskeletal:     Left knee: No swelling, deformity, effusion, ecchymosis or crepitus. Decreased range of motion. Tenderness present over the MCL. No LCL laxity, MCL laxity, ACL laxity or PCL laxity.Normal meniscus and normal patellar mobility. Normal pulse.       Legs:     Comments: Left knee:  No effusion, erythema, or warmth.  Knee stable, negative drawer test.  McMurray test negative.  There is mild tenderness to palpation over the MCL.  Skin:    General: Skin is warm and dry.  Neurological:     Mental Status: She is alert and oriented to  person,  place, and time.      UC Treatments / Results  Labs (all labs ordered are listed, but only abnormal results are displayed) Labs Reviewed - No data to display  EKG   Radiology DG Knee Complete 4 Views Left  Result Date: 01/26/2021 CLINICAL DATA:  Left knee pain and swelling, injury last week EXAM: LEFT KNEE - COMPLETE 4+ VIEW COMPARISON:  None. FINDINGS: Frontal, bilateral oblique, lateral views of the left knee are obtained. No fracture, subluxation, or dislocation. Joint spaces are well preserved. No joint effusion. IMPRESSION: 1. Unremarkable left knee. Electronically Signed   By: Randa Ngo M.D.   On: 01/26/2021 15:34    Procedures Procedures (including critical care time)  Medications Ordered in UC Medications - No data to display  Initial Impression / Assessment and Plan / UC Course  I have reviewed the triage vital signs and the nursing notes.  Pertinent labs & imaging results that were available during my care of the patient were reviewed by me and considered in my medical decision making (see chart for details).    Dispensed hinged knee brace. Followup with Dr. Aundria Mems ([facility]) if not improving about two weeks.    Final Clinical Impressions(s) / UC Diagnoses   Final diagnoses:  Sprain of medial collateral ligament of left knee, initial encounter     Discharge Instructions     Apply ice pack for 30 minutes every 1 to 2 hours today and tomorrow.  Elevate.  Use a cane if you feel unstable.  Wear brace for about 2 weeks.  May take Tylenol as needed for pain. Begin range of motion and stretching exercises in about 5 days as per instruction sheet.     ED Prescriptions    None        Kandra Nicolas, MD 01/27/21 1451

## 2021-01-31 HISTORY — PX: KNEE ARTHROSCOPY: SUR90

## 2021-02-03 ENCOUNTER — Encounter: Payer: Self-pay | Admitting: Physician Assistant

## 2021-02-03 ENCOUNTER — Ambulatory Visit (INDEPENDENT_AMBULATORY_CARE_PROVIDER_SITE_OTHER): Payer: Medicare Other | Admitting: Physician Assistant

## 2021-02-03 ENCOUNTER — Other Ambulatory Visit: Payer: Self-pay

## 2021-02-03 VITALS — BP 120/49 | HR 100 | Ht 67.5 in | Wt 168.0 lb

## 2021-02-03 DIAGNOSIS — S83412A Sprain of medial collateral ligament of left knee, initial encounter: Secondary | ICD-10-CM | POA: Insufficient documentation

## 2021-02-03 DIAGNOSIS — S83412D Sprain of medial collateral ligament of left knee, subsequent encounter: Secondary | ICD-10-CM

## 2021-02-03 DIAGNOSIS — M25562 Pain in left knee: Secondary | ICD-10-CM | POA: Diagnosis not present

## 2021-02-03 MED ORDER — TRAMADOL HCL 50 MG PO TABS: 50.0000 mg | ORAL_TABLET | Freq: Four times a day (QID) | ORAL | 0 refills | Status: AC | PRN

## 2021-02-03 NOTE — Patient Instructions (Signed)
Order MRI of knee.  Continue tylenol and icing.  Diclofenac gel as needed up to 4 times a day.  Tramadol for break through pain.

## 2021-02-03 NOTE — Progress Notes (Signed)
Subjective:    Patient ID: Gabrielle Torres, female    DOB: [birth year], 80 y.o.   MRN: [id number]  HPI  Patient is a 80 year old female with a recent twisting injury of her left knee on 01/26/2021 while doing tai chi.  She went to urgent care.  X-rays were normal.  Suspected sprain of left knee.  She was placed in a hinged knee brace and encouraged to take Tylenol for pain.  She has been icing her knee regularly.  Her pain has not improved and seems that she has more swollen behind the posterior knee.  She has limited range of motion due to pain of her left knee.  .. Active Ambulatory Problems    Diagnosis Date Noted  . Vitamin D deficiency 12/28/2015  . Hyperlipidemia 12/28/2015  . Adenomatous polyp of colon 05/07/2016  . Post-menopausal 05/09/2016  . Piriformis syndrome of right side 05/09/2016  . Hypertriglyceridemia 01/09/2017  . Idiopathic hypotension 01/14/2018  . Itchy, watery, and red eye 01/14/2018  . Age-related osteoporosis without current pathological fracture 01/13/2019  . Osteopenia 02/03/2019  . Urinary frequency 12/31/2019  . Urinary urgency 12/31/2019  . Atrophic vaginitis 12/31/2019  . Sprain of medial collateral ligament of left knee 02/03/2021  . Acute pain of left knee 02/03/2021   Resolved Ambulatory Problems    Diagnosis Date Noted  . CKD (chronic kidney disease) stage 3, GFR 30-59 ml/min (HCC) 02/06/2017  . Mass of right wrist 04/15/2018   No Additional Past Medical History      Review of Systems  All other systems reviewed and are negative.      Objective:   Physical Exam Vitals reviewed.  Constitutional:      Appearance: Normal appearance.  Cardiovascular:     Rate and Rhythm: Normal rate and regular rhythm.     Pulses: Normal pulses.  Pulmonary:     Effort: Pulmonary effort is normal.     Breath sounds: Normal breath sounds.  Musculoskeletal:     Right lower leg: No edema.     Left lower leg: No edema.     Comments: Left knee:  Not able to  fully extend of flex knee due to pain.  Not able to bear weight on left leg.  No effusion, warmth. Tenderness over MCL and posterior knee.  Fullness behind the knee. No significant pain with mcmurrays.  Anterior drawer intact.   Neurological:     Mental Status: She is alert.  Psychiatric:        Mood and Affect: Mood normal.           Assessment & Plan:  .[name] was seen today for knee pain.  Diagnoses and all orders for this visit:  Acute pain of left knee -     traMADol (ULTRAM) 50 MG tablet; Take 1 tablet (50 mg total) by mouth every 6 (six) hours as needed for up to 5 days. -     MR Knee Left  Wo Contrast  Sprain of medial collateral ligament of left knee, subsequent encounter -     traMADol (ULTRAM) 50 MG tablet; Take 1 tablet (50 mg total) by mouth every 6 (six) hours as needed for up to 5 days. -     MR Knee Left  Wo Contrast   Concern for meniscal tear.  Xray no acute findings.  MrI ordered.  Continue tylenol/icing/brace. Added diclofenac gel due to decreased kidney function.  Tramadol for break through pain.   Marland Kitchen.PDMP reviewed during this encounter.

## 2021-02-04 ENCOUNTER — Ambulatory Visit (INDEPENDENT_AMBULATORY_CARE_PROVIDER_SITE_OTHER): Payer: Medicare Other

## 2021-02-04 DIAGNOSIS — S83412D Sprain of medial collateral ligament of left knee, subsequent encounter: Secondary | ICD-10-CM

## 2021-02-04 DIAGNOSIS — M25562 Pain in left knee: Secondary | ICD-10-CM | POA: Diagnosis not present

## 2021-02-04 DIAGNOSIS — M7122 Synovial cyst of popliteal space [Baker], left knee: Secondary | ICD-10-CM | POA: Diagnosis not present

## 2021-02-04 DIAGNOSIS — S8992XA Unspecified injury of left lower leg, initial encounter: Secondary | ICD-10-CM | POA: Diagnosis not present

## 2021-02-04 DIAGNOSIS — S83242A Other tear of medial meniscus, current injury, left knee, initial encounter: Secondary | ICD-10-CM | POA: Diagnosis not present

## 2021-02-04 DIAGNOSIS — M25462 Effusion, left knee: Secondary | ICD-10-CM | POA: Diagnosis not present

## 2021-02-06 ENCOUNTER — Other Ambulatory Visit: Payer: Self-pay | Admitting: Physician Assistant

## 2021-02-06 DIAGNOSIS — S83242D Other tear of medial meniscus, current injury, left knee, subsequent encounter: Secondary | ICD-10-CM

## 2021-02-06 DIAGNOSIS — M25562 Pain in left knee: Secondary | ICD-10-CM

## 2021-02-06 NOTE — Progress Notes (Signed)
Gabrielle Torres,   You have a complete radial medial mensicus tear. I will place a referral to orthopedic surgery today.

## 2021-02-10 ENCOUNTER — Ambulatory Visit (INDEPENDENT_AMBULATORY_CARE_PROVIDER_SITE_OTHER): Payer: Medicare Other | Admitting: Orthopaedic Surgery

## 2021-02-10 ENCOUNTER — Encounter: Payer: Self-pay | Admitting: Orthopaedic Surgery

## 2021-02-10 ENCOUNTER — Other Ambulatory Visit: Payer: Self-pay

## 2021-02-10 VITALS — Ht 68.0 in | Wt 168.0 lb

## 2021-02-10 DIAGNOSIS — S83242A Other tear of medial meniscus, current injury, left knee, initial encounter: Secondary | ICD-10-CM | POA: Diagnosis not present

## 2021-02-10 NOTE — Progress Notes (Signed)
Office Visit Note   Patient: Gabrielle Torres           Date of Birth: 1941-06-11           MRN: [id number] Visit Date: 02/10/2021              Requested by: Donella Stade, PA-C [place],  [place] [address] PCP: Donella Stade, PA-C   Assessment & Plan: Visit Diagnoses:  1. Acute medial meniscus tear of left knee, initial encounter     Plan: I impression is large radial tear of the medial meniscus root as well as a horizontal tear of the posterior horn.  She has no evidence of chondromalacia.  These MRI findings were reviewed with the patient in detail.  Given the fact that she is extremely active and this injury is preventing her from exercise as well as ADLs and the fact that she lacks any chondromalacia I have recommended knee arthroscopy with debridement versus repair based on operative findings.  Risk benefits rehab recovery reviewed with the patient in detail.  Questions encouraged and answered.  We will look to do the surgery as soon as possible.  Follow-Up Instructions: Return if symptoms worsen or fail to improve.   Orders:  No orders of the defined types were placed in this encounter.  No orders of the defined types were placed in this encounter.     Procedures: No procedures performed   Clinical Data: No additional findings.   Subjective: Chief Complaint  Patient presents with  . Left Knee - Injury    Patient is a very active and healthy 80 year old female who injured her left knee 2 weeks ago while doing tai chi.  She pivoted her body with a planted foot and felt a pop with immediate pain and swelling afterwards.  Since then she has had pain and difficulty with ambulation.  She has been using a knee brace as well as a cane to help.  She is unable to flex her knee without pain.  She no longer has the effusion.  Denies any catching.   Review of Systems  Constitutional: Negative.   HENT: Negative.   Eyes: Negative.   Respiratory:  Negative.   Cardiovascular: Negative.   Endocrine: Negative.   Musculoskeletal: Negative.   Neurological: Negative.   Hematological: Negative.   Psychiatric/Behavioral: Negative.   All other systems reviewed and are negative.    Objective: Vital Signs: Ht 5' 8"  (1.727 m)   Wt 168 lb (76.2 kg)   BMI 25.54 kg/m   Physical Exam Vitals and nursing note reviewed.  Constitutional:      Appearance: She is well-developed.  HENT:     Head: Normocephalic and atraumatic.  Pulmonary:     Effort: Pulmonary effort is normal.  Abdominal:     Palpations: Abdomen is soft.  Musculoskeletal:     Cervical back: Neck supple.  Skin:    General: Skin is warm.     Capillary Refill: Capillary refill takes less than 2 seconds.  Neurological:     Mental Status: She is alert and oriented to person, place, and time.  Psychiatric:        Behavior: Behavior normal.        Thought Content: Thought content normal.        Judgment: Judgment normal.     Ortho Exam Left knee shows trace effusion.  Medial joint line tenderness.  Pain in the back of the knee with  flexion past 120 degrees.  Positive McMurray.  Collaterals and cruciates are stable. Specialty Comments:  No specialty comments available.  Imaging: No results found.   PMFS History: Patient Active Problem List   Diagnosis Date Noted  . Sprain of medial collateral ligament of left knee 02/03/2021  . Acute pain of left knee 02/03/2021  . Urinary frequency 12/31/2019  . Urinary urgency 12/31/2019  . Atrophic vaginitis 12/31/2019  . Osteopenia 02/03/2019  . Age-related osteoporosis without current pathological fracture 01/13/2019  . Idiopathic hypotension 01/14/2018  . Itchy, watery, and red eye 01/14/2018  . Hypertriglyceridemia 01/09/2017  . Post-menopausal 05/09/2016  . Piriformis syndrome of right side 05/09/2016  . Adenomatous polyp of colon 05/07/2016  . Vitamin D deficiency 12/28/2015  . Hyperlipidemia 12/28/2015   Past  Medical History:  Diagnosis Date  . Hyperlipidemia     Family History  Problem Relation Age of Onset  . Depression Father   . Suicidality Father   . Breast cancer Mother 38  . Hypertension Son   . Hypertension Paternal Aunt   . Atrial fibrillation Daughter     Past Surgical History:  Procedure Laterality Date  . CHOLECYSTECTOMY     Social History   Occupational History  . Occupation: retired    Comment: housekeeper  Tobacco Use  . Smoking status: Former Research scientist (life sciences)  . Smokeless tobacco: Never Used  . Tobacco comment: quit 50 years ago  Vaping Use  . Vaping Use: Never used  Substance and Sexual Activity  . Alcohol use: Yes    Alcohol/week: 0.0 standard drinks    Comment: Occasional  . Drug use: No  . Sexual activity: Not Currently    Birth control/protection: None, Post-menopausal

## 2021-02-22 ENCOUNTER — Other Ambulatory Visit: Payer: Self-pay | Admitting: Physician Assistant

## 2021-02-22 MED ORDER — HYDROCODONE-ACETAMINOPHEN 5-325 MG PO TABS: 1.0000 | ORAL_TABLET | Freq: Four times a day (QID) | ORAL | 0 refills | Status: DC | PRN

## 2021-02-22 MED ORDER — ONDANSETRON HCL 4 MG PO TABS: 4.0000 mg | ORAL_TABLET | Freq: Three times a day (TID) | ORAL | 0 refills | Status: DC | PRN

## 2021-02-23 ENCOUNTER — Encounter: Payer: Self-pay | Admitting: Orthopaedic Surgery

## 2021-02-23 DIAGNOSIS — X58XXXA Exposure to other specified factors, initial encounter: Secondary | ICD-10-CM | POA: Diagnosis not present

## 2021-02-23 DIAGNOSIS — S83282A Other tear of lateral meniscus, current injury, left knee, initial encounter: Secondary | ICD-10-CM | POA: Diagnosis not present

## 2021-02-23 DIAGNOSIS — S83242A Other tear of medial meniscus, current injury, left knee, initial encounter: Secondary | ICD-10-CM | POA: Insufficient documentation

## 2021-02-23 DIAGNOSIS — M94262 Chondromalacia, left knee: Secondary | ICD-10-CM | POA: Diagnosis not present

## 2021-02-23 DIAGNOSIS — Y999 Unspecified external cause status: Secondary | ICD-10-CM | POA: Diagnosis not present

## 2021-02-23 DIAGNOSIS — G8918 Other acute postprocedural pain: Secondary | ICD-10-CM | POA: Diagnosis not present

## 2021-02-23 DIAGNOSIS — M659 Synovitis and tenosynovitis, unspecified: Secondary | ICD-10-CM | POA: Diagnosis not present

## 2021-03-02 ENCOUNTER — Encounter: Payer: Self-pay | Admitting: Orthopaedic Surgery

## 2021-03-02 ENCOUNTER — Other Ambulatory Visit: Payer: Self-pay

## 2021-03-02 ENCOUNTER — Ambulatory Visit (INDEPENDENT_AMBULATORY_CARE_PROVIDER_SITE_OTHER): Payer: Medicare Other | Admitting: Orthopaedic Surgery

## 2021-03-02 VITALS — Ht 68.0 in | Wt 168.0 lb

## 2021-03-02 DIAGNOSIS — M1712 Unilateral primary osteoarthritis, left knee: Secondary | ICD-10-CM

## 2021-03-02 DIAGNOSIS — S83242A Other tear of medial meniscus, current injury, left knee, initial encounter: Secondary | ICD-10-CM

## 2021-03-02 NOTE — Progress Notes (Signed)
   Post-Op Visit Note   Patient: Gabrielle Torres           Date of Birth: 06-Oct-1941           MRN: [id number] Visit Date: 03/02/2021 PCP: Donella Stade, PA-C   Assessment & Plan:  Chief Complaint:  Chief Complaint  Patient presents with  . Left Knee - Follow-up    Left knee arthroscopy 02/23/2021   Visit Diagnoses:  1. Acute medial meniscus tear of left knee, initial encounter   2. Primary osteoarthritis of left knee     Plan:   Patient is 1 week status post left knee arthroscopy with debridement of a partial left medial meniscal root tear along with synovectomy.  She states that overall she is doing quite well and only has to take Tylenol.  She is ambulating without any assistance.  Surgical scars are fully healed.  No joint effusion.  Decent range of motion.  We remove the sutures today.  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  I have recommended that she get fitted for a medial unloader brace at this time.  Home exercises were provided today.  We will recheck her in 4 weeks.  Follow-Up Instructions: Return in about 4 weeks (around 03/30/2021).   Orders:  No orders of the defined types were placed in this encounter.  No orders of the defined types were placed in this encounter.   Imaging: No results found.  PMFS History: Patient Active Problem List   Diagnosis Date Noted  . Acute medial meniscus tear, left, initial encounter 02/23/2021  . Sprain of medial collateral ligament of left knee 02/03/2021  . Acute pain of left knee 02/03/2021  . Urinary frequency 12/31/2019  . Urinary urgency 12/31/2019  . Atrophic vaginitis 12/31/2019  . Osteopenia 02/03/2019  . Age-related osteoporosis without current pathological fracture 01/13/2019  . Idiopathic hypotension 01/14/2018  . Itchy, watery, and red eye 01/14/2018  . Hypertriglyceridemia  01/09/2017  . Post-menopausal 05/09/2016  . Piriformis syndrome of right side 05/09/2016  . Adenomatous polyp of colon 05/07/2016  . Vitamin D deficiency 12/28/2015  . Hyperlipidemia 12/28/2015   Past Medical History:  Diagnosis Date  . Hyperlipidemia     Family History  Problem Relation Age of Onset  . Depression Father   . Suicidality Father   . Breast cancer Mother 57  . Hypertension Son   . Hypertension Paternal Aunt   . Atrial fibrillation Daughter     Past Surgical History:  Procedure Laterality Date  . CHOLECYSTECTOMY     Social History   Occupational History  . Occupation: retired    Comment: housekeeper  Tobacco Use  . Smoking status: Former Research scientist (life sciences)  . Smokeless tobacco: Never Used  . Tobacco comment: quit 50 years ago  Vaping Use  . Vaping Use: Never used  Substance and Sexual Activity  . Alcohol use: Yes    Alcohol/week: 0.0 standard drinks    Comment: Occasional  . Drug use: No  . Sexual activity: Not Currently    Birth control/protection: None, Post-menopausal

## 2021-04-04 ENCOUNTER — Encounter: Payer: Self-pay | Admitting: Orthopaedic Surgery

## 2021-04-04 ENCOUNTER — Ambulatory Visit (INDEPENDENT_AMBULATORY_CARE_PROVIDER_SITE_OTHER): Payer: Medicare Other | Admitting: Orthopaedic Surgery

## 2021-04-04 ENCOUNTER — Other Ambulatory Visit: Payer: Self-pay

## 2021-04-04 DIAGNOSIS — S83242A Other tear of medial meniscus, current injury, left knee, initial encounter: Secondary | ICD-10-CM

## 2021-04-04 DIAGNOSIS — M1712 Unilateral primary osteoarthritis, left knee: Secondary | ICD-10-CM

## 2021-04-04 MED ORDER — LIDOCAINE HCL 1 % IJ SOLN
2.0000 mL | INTRAMUSCULAR | Status: AC | PRN
  Administered 2021-04-04: 2 mL

## 2021-04-04 MED ORDER — METHYLPREDNISOLONE ACETATE 40 MG/ML IJ SUSP
40.0000 mg | INTRAMUSCULAR | Status: AC | PRN
  Administered 2021-04-04: 40 mg via INTRA_ARTICULAR

## 2021-04-04 MED ORDER — BUPIVACAINE HCL 0.5 % IJ SOLN
2.0000 mL | INTRAMUSCULAR | Status: AC | PRN
  Administered 2021-04-04: 2 mL via INTRA_ARTICULAR

## 2021-04-04 NOTE — Progress Notes (Signed)
Office Visit Note   Patient: Gabrielle Torres           Date of Birth: 1941-12-01           MRN: [id number] Visit Date: 04/04/2021              Requested by: Donella Stade, PA-C [place],  [place] [address] PCP: Donella Stade, PA-C   Assessment & Plan: Visit Diagnoses:  1. Acute medial meniscus tear of left knee, initial encounter   2. Primary osteoarthritis of left knee     Plan: Impression is status post left knee scope partial medial meniscectomy.  We again discussed that she had a fair amount of chondromalacia of the medial compartment that was a lot more than what the MRI showed.  My sense is that the arthritis is flared up from the recent surgery.  Especially with her upcoming trip I recommended a cortisone injection which she agreed to and tolerated well.  She will contact Ryan to have the brace adjusted for her.  She will call us back if she needs anything or if she has any worsening of her symptoms.  Follow-Up Instructions: Return if symptoms worsen or fail to improve.   Orders:  No orders of the defined types were placed in this encounter.  No orders of the defined types were placed in this encounter.     Procedures: Large Joint Inj: L knee on 04/04/2021 10:23 AM Details: 22 G needle Medications: 2 mL bupivacaine 0.5 %; 2 mL lidocaine 1 %; 40 mg methylPREDNISolone acetate 40 MG/ML Outcome: tolerated well, no immediate complications Patient was prepped and draped in the usual sterile fashion.       Clinical Data: No additional findings.   Subjective: Chief Complaint  Patient presents with  . Left Knee - Pain    Patient returns today status post left knee scope partial medial meniscectomy on 02/23/2021.  She is currently wearing an old unloader brace.  This is bothering her slightly with pressure on the medial side.  Denies any swelling.  She been using Biofreeze and ice for the pain which is worse at night.  She is planning to go  to [place] in 2 weeks.  She does experience some aching discomfort on the medial side of the knee.  Denies any mechanical symptoms.   Review of Systems   Objective: Vital Signs: There were no vitals taken for this visit.  Physical Exam  Ortho Exam Right knee shows fully healed surgical scars.  No medial joint line tenderness.  Range of motion is slightly limited secondary to swelling.  No joint effusion.  Collaterals and cruciates are stable. Specialty Comments:  No specialty comments available.  Imaging: No results found.   PMFS History: Patient Active Problem List   Diagnosis Date Noted  . Acute medial meniscus tear, left, initial encounter 02/23/2021  . Sprain of medial collateral ligament of left knee 02/03/2021  . Acute pain of left knee 02/03/2021  . Urinary frequency 12/31/2019  . Urinary urgency 12/31/2019  . Atrophic vaginitis 12/31/2019  . Osteopenia 02/03/2019  . Age-related osteoporosis without current pathological fracture 01/13/2019  . Idiopathic hypotension 01/14/2018  . Itchy, watery, and red eye 01/14/2018  . Hypertriglyceridemia 01/09/2017  . Post-menopausal 05/09/2016  . Piriformis syndrome of right side 05/09/2016  . Adenomatous polyp of colon 05/07/2016  . Vitamin D deficiency 12/28/2015  . Hyperlipidemia 12/28/2015   Past Medical History:  Diagnosis Date  .  Hyperlipidemia     Family History  Problem Relation Age of Onset  . Depression Father   . Suicidality Father   . Breast cancer Mother 60  . Hypertension Son   . Hypertension Paternal Aunt   . Atrial fibrillation Daughter     Past Surgical History:  Procedure Laterality Date  . CHOLECYSTECTOMY     Social History   Occupational History  . Occupation: retired    Comment: housekeeper  Tobacco Use  . Smoking status: Former Research scientist (life sciences)  . Smokeless tobacco: Never Used  . Tobacco comment: quit 50 years ago  Vaping Use  . Vaping Use: Never used  Substance and Sexual Activity  . Alcohol  use: Yes    Alcohol/week: 0.0 standard drinks    Comment: Occasional  . Drug use: No  . Sexual activity: Not Currently    Birth control/protection: None, Post-menopausal

## 2021-07-21 DIAGNOSIS — Z20822 Contact with and (suspected) exposure to covid-19: Secondary | ICD-10-CM | POA: Diagnosis not present

## 2021-12-13 ENCOUNTER — Other Ambulatory Visit: Payer: Self-pay | Admitting: Physician Assistant

## 2022-01-16 DIAGNOSIS — Z20822 Contact with and (suspected) exposure to covid-19: Secondary | ICD-10-CM | POA: Diagnosis not present

## 2022-01-19 ENCOUNTER — Other Ambulatory Visit: Payer: Self-pay

## 2022-01-19 ENCOUNTER — Ambulatory Visit (INDEPENDENT_AMBULATORY_CARE_PROVIDER_SITE_OTHER): Payer: Medicare Other | Admitting: Physician Assistant

## 2022-01-19 VITALS — BP 116/62 | HR 72 | Ht 68.0 in | Wt 168.1 lb

## 2022-01-19 DIAGNOSIS — Z23 Encounter for immunization: Secondary | ICD-10-CM

## 2022-01-19 DIAGNOSIS — Z0000 Encounter for general adult medical examination without abnormal findings: Secondary | ICD-10-CM

## 2022-01-19 NOTE — Progress Notes (Signed)
MEDICARE ANNUAL WELLNESS VISIT  01/19/2022  Subjective:  Gabrielle Torres is a 81 y.o. female patient of Donella Stade, PA-C who had a [employer] Visit today. Gabrielle Torres is Retired and lives with their daughter. she has 3 children. she reports that she is socially active and does interact with friends/family regularly. she is moderately physically active and enjoys playing cards and staying active.  Patient Care Team: Lavada Mesi as PCP - General (Family Medicine)  Advanced Directives 01/19/2022 11/23/2020 11/23/2019 11/17/2018 12/28/2015  Does Patient Have a Medical Advance Directive? Yes No No No No  Type of Advance Directive Living will;Healthcare Power of Attorney - - - -  Does patient want to make changes to medical advance directive? No - Patient declined - - - -  Copy of [place] in Chart? No - copy requested - - - -  Would patient like information on creating a medical advance directive? - No - Patient declined No - Patient declined No - Patient declined Yes - Educational materials given    Hospital Utilization Over the Past 12 Months: # of hospitalizations or ER visits: 1 # of surgeries: 1  Review of Systems    Patient reports that her overall health is unchanged when compared to last year.  Review of Systems: History obtained from chart review and the patient  All other systems negative.  Pain Assessment Pain : No/denies pain     Current Medications & Allergies (verified) Allergies as of 01/19/2022   No Known Allergies      Medication List        Accurate as of January 19, 2022 11:49 AM. If you have any questions, ask your nurse or doctor.          AMBULATORY NON FORMULARY MEDICATION shingrx 2 doses to prevent shingles.   aspirin 81 MG tablet Take 81 mg by mouth daily.   calcium-vitamin D 500-200 MG-UNIT tablet Commonly known as: OSCAL WITH D Take 1 tablet by mouth.   estradiol 0.1 MG/GM vaginal cream Commonly  known as: ESTRACE VAGINAL Apply 1 applicator full in the vagina nightly x 1 week then reduce to three times weekly.   famotidine 20 MG tablet Commonly known as: PEPCID Take 20 mg by mouth 2 (two) times daily.   FISH OIL PO Take 3,000 mg by mouth daily.   HYDROcodone-acetaminophen 5-325 MG tablet Commonly known as: Norco Take 1 tablet by mouth every 6 (six) hours as needed. To be taken after surgery   ondansetron 4 MG tablet Commonly known as: Zofran Take 1 tablet (4 mg total) by mouth every 8 (eight) hours as needed for nausea or vomiting.   oxybutynin 5 MG 24 hr tablet Commonly known as: Ditropan XL Take 1 tablet (5 mg total) by mouth at bedtime.   phenazopyridine 100 MG tablet Commonly known as: PYRIDIUM Take 1 tablet (100 mg total) by mouth 3 (three) times daily as needed for pain.   polyethylene glycol powder 17 GM/SCOOP powder Commonly known as: MiraLax Take 17 g by mouth daily. 17g = 1 capful   pravastatin 40 MG tablet Commonly known as: PRAVACHOL Take 1 tablet (40 mg total) by mouth daily. NEEDS LABS   Vitamin D 125 MCG (5000 UT) Caps Take 5,000 Units by mouth daily.   VITAMIN E PO Take by mouth daily.        History (reviewed): Past Medical History:  Diagnosis Date   Hyperlipidemia    Past Surgical History:  Procedure Laterality Date   CHOLECYSTECTOMY     KNEE ARTHROSCOPY Left 01/2021   Family History  Problem Relation Age of Onset   Depression Father    Suicidality Father    Breast cancer Mother 54   Hypertension Son    Hypertension Paternal Aunt    Atrial fibrillation Daughter    Social History   Socioeconomic History   Marital status: Widowed    Spouse name: Not on file   Number of children: 3   Years of education: 44   Highest education level: 12th grade  Occupational History   Occupation: retired    Comment: housekeeper  Tobacco Use   Smoking status: Former   Smokeless tobacco: Never   Tobacco comments:    quit 50 years ago   Vaping Use   Vaping Use: Never used  Substance and Sexual Activity   Alcohol use: Yes    Alcohol/week: 1.0 standard drink    Types: 1 Shots of liquor per week    Comment: Occasional   Drug use: No   Sexual activity: Not Currently    Birth control/protection: None, Post-menopausal  Other Topics Concern   Not on file  Social History Narrative   Does Gabrielle Torres 2 times a week. Plays cards with friends during the week. Helps take Care of daughters horse 2 times a month. Very active. Does a lot of games, reading. Lives with her daughter.    Social Determinants of Health   Financial Resource Strain: Low Risk    Difficulty of Paying Living Expenses: Not hard at all  Food Insecurity: No Food Insecurity   Worried About Charity fundraiser in the Last Year: Never true   Liberal in the Last Year: Never true  Transportation Needs: No Transportation Needs   Lack of Transportation (Medical): No   Lack of Transportation (Non-Medical): No  Physical Activity: Sufficiently Active   Days of Exercise per Week: 4 days   Minutes of Exercise per Session: 60 min  Stress: No Stress Concern Present   Feeling of Stress : Not at all  Social Connections: Moderately Isolated   Frequency of Communication with Friends and Family: More than three times a week   Frequency of Social Gatherings with Friends and Family: More than three times a week   Attends Religious Services: Never   Marine scientist or Organizations: Yes   Attends Music therapist: More than 4 times per year   Marital Status: Widowed    Activities of Daily Living In your present state of health, do you have any difficulty performing the following activities: 01/19/2022  Hearing? Y  Comment has noticed bilateral hearing loss.  Vision? N  Difficulty concentrating or making decisions? N  Walking or climbing stairs? N  Dressing or bathing? N  Doing errands, shopping? N  Preparing Food and eating ? N  Using the  Toilet? N  In the past six months, have you accidently leaked urine? N  Do you have problems with loss of bowel control? N  Managing your Medications? N  Managing your Finances? N  Housekeeping or managing your Housekeeping? N  Some recent data might be hidden    Patient Education/Literacy How often do you need to have someone help you when you read instructions, pamphlets, or other written materials from your doctor or pharmacy?: 1 - Never What is the last grade level you completed in school?: High school  Exercise Current Exercise Habits: Structured exercise class,  Time (Minutes): 60, Frequency (Times/Week): 4, Weekly Exercise (Minutes/Week): 240, Intensity: Moderate, Exercise limited by: None identified  Diet Patient reports consuming 3 meals a day and 2 snack(s) a day Patient reports that her primary diet is: Regular Patient reports that she does have regular access to food.   Depression Screen PHQ 2/9 Scores 01/19/2022 11/23/2020 11/23/2020 11/23/2019 02/03/2019 01/13/2019 11/17/2018  PHQ - 2 Score 0 0 0 0 0 0 0  PHQ- 9 Score - - - - 0 - -     Fall Risk Fall Risk  01/19/2022 11/23/2020 11/23/2019 10/28/2019 11/17/2018  Falls in the past year? 0 0 1 0 0  Comment - - - Emmi Telephone Survey: data to providers prior to load -  Number falls in past yr: 0 0 0 - -  Injury with Fall? 0 0 1 - -  Comment - - cracked rib from fall in kitchen - -  Risk for fall due to : No Fall Risks No Fall Risks - - -  Follow up Falls evaluation completed Falls evaluation completed Falls prevention discussed - -     Objective:   BP 116/62 (BP Location: Right Arm, Patient Position: Sitting, Cuff Size: Normal)    Pulse 72    Ht 5\' 8"  (1.727 m)    Wt 168 lb 1.9 oz (76.3 kg)    SpO2 92%    BMI 25.56 kg/m   Last Weight  Most recent update: 01/19/2022 10:59 AM    Weight  76.3 kg (168 lb 1.9 oz)             Body mass index is 25.56 kg/m.  Hearing/Vision  Gabrielle Torres did not have difficulty with  hearing/understanding during the face-to-face interview Gabrielle Torres did not have difficulty with her vision during the face-to-face interview Reports that she has not had a formal eye exam by an eye care professional within the past year Reports that she has not had a formal hearing evaluation within the past year  Cognitive Function: 6CIT Screen 01/19/2022 11/23/2020 11/23/2019 11/17/2018  What Year? 0 points 0 points 0 points 0 points  What month? 0 points 0 points 0 points 0 points  What time? 0 points 0 points 0 points 0 points  Count back from 20 0 points 0 points 0 points 0 points  Months in reverse 0 points 2 points 0 points 2 points  Repeat phrase 0 points 2 points 0 points 0 points  Total Score 0 4 0 2    Normal Cognitive Function Screening: Yes (Normal:0-7, Significant for Dysfunction: >8)  Immunization & Health Maintenance Record Immunization History  Administered Date(s) Administered   Fluad Quad(high Dose 65+) 09/14/2019, 11/07/2020, 01/19/2022   Influenza, High Dose Seasonal PF 12/31/2017   Influenza,inj,Quad PF,6+ Mos 01/09/2017, 11/17/2018   PFIZER(Purple Top)SARS-COV-2 Vaccination 01/28/2020, 02/23/2020   Pneumococcal Conjugate-13 12/28/2015   Pneumococcal Polysaccharide-23 01/09/2017   Tdap 12/28/2015, 11/24/2017    Health Maintenance  Topic Date Due   COVID-19 Vaccine (3 - Booster for [place] series) 02/04/2022 (Originally 04/19/2020)   Zoster Vaccines- Shingrix (1 of 2) 04/18/2022 (Originally 07/15/1993)   MAMMOGRAM  04/13/2022   COLONOSCOPY (Pts 45-64yrs Insurance coverage will need to be confirmed)  07/01/2022   TETANUS/TDAP  11/25/2027   Pneumonia Vaccine 83+ Years old  Completed   INFLUENZA VACCINE  Completed   DEXA SCAN  Completed   Hepatitis C Screening  Completed   HPV VACCINES  Aged Out       Assessment  This is a  routine wellness examination for Gabrielle Torres.  Health Maintenance: Due or Overdue There are no preventive care reminders to display for  this patient.   Gabrielle Torres does not need a referral for Community Assistance: Care Management:   no Social Work:    no Prescription Assistance:  no Nutrition/Diabetes Education:  no   Plan:  Personalized Goals  Goals Addressed               This Visit's Progress     Patient Stated (pt-stated)        Loose a few lbs.       Personalized Health Maintenance & Screening Recommendations  Influenza vaccine Screening mammography Bone densitometry screening Colorectal cancer screening Shingles vaccine  Colonoscopy due in July, 2023.  Lung Cancer Screening Recommended: no (Low Dose CT Chest recommended if Age 39-80 years, 30 pack-year currently smoking OR have quit w/in past 15 years) Hepatitis C Screening recommended: no HIV Screening recommended: no  Advanced Directives: Written information was not given per the patient's request.  Referrals & Orders Orders Placed This Encounter  Procedures   Flu Vaccine QUAD High Dose(Fluad)    Follow-up Plan Follow-up with Donella Stade, PA-C as planned Schedule your shingles vaccine at the pharmacy.  Bone density referral has been sent and they will call you to schedule.  Please have your Gastroenterologist send [place] a letter regarding your colonoscopy.  Medicare wellness visit in one year. AVS printed and given to the patient.   I have personally reviewed and noted the following in the patients chart:   Medical and social history Use of alcohol, tobacco or illicit drugs  Current medications and supplements Functional ability and status Nutritional status Physical activity Advanced directives List of other physicians Hospitalizations, surgeries, and ER visits in previous 12 months Vitals Screenings to include cognitive, depression, and falls Referrals and appointments  In addition, I have reviewed and discussed with patient certain preventive protocols, quality metrics, and best practice recommendations. A written  personalized care plan for preventive services as well as general preventive health recommendations were provided to patient.     Tinnie Gens, RN  01/19/2022

## 2022-01-19 NOTE — Patient Instructions (Addendum)
[place] Maintenance Summary and Written Plan of Care  [name] ,  Thank you for allowing me to perform your Medicare Annual Wellness Visit and for your ongoing commitment to your health.   Health Maintenance & Immunization History Health Maintenance  Topic Date Due   COVID-19 Vaccine (3 - Booster for Pfizer series) 02/04/2022 (Originally 04/19/2020)   INFLUENZA VACCINE  03/02/2022 (Originally 07/03/2021)   Zoster Vaccines- Shingrix (1 of 2) 04/18/2022 (Originally 07/15/1993)   MAMMOGRAM  04/13/2022   COLONOSCOPY (Pts 45-48yrs Insurance coverage will need to be confirmed)  07/01/2022   TETANUS/TDAP  11/25/2027   Pneumonia Vaccine 60+ Years old  Completed   DEXA SCAN  Completed   Hepatitis C Screening  Completed   HPV VACCINES  Aged Out   Immunization History  Administered Date(s) Administered   Fluad Quad(high Dose 65+) 09/14/2019, 11/07/2020   Influenza, High Dose Seasonal PF 12/31/2017   Influenza,inj,Quad PF,6+ Mos 01/09/2017, 11/17/2018   PFIZER(Purple Top)SARS-COV-2 Vaccination 01/28/2020, 02/23/2020   Pneumococcal Conjugate-13 12/28/2015   Pneumococcal Polysaccharide-23 01/09/2017   Tdap 12/28/2015, 11/24/2017    These are the patient goals that we discussed:  Goals Addressed               This Visit's Progress     Patient Stated (pt-stated)        Loose a few lbs.         This is a list of Health Maintenance Items that are overdue or due now: Influenza vaccine Screening mammography Bone densitometry screening Colorectal cancer screening Shingles vaccine  Colonoscopy due in July, 2023.    Orders/Referrals Placed Today: Orders Placed This Encounter  Procedures   Flu vaccine HIGH DOSE PF (Fluzone High dose)   (Contact our referral department at [phone] if you have not spoken with someone about your referral appointment within the next 5 days)    Follow-up Plan Follow-up with Donella Stade, PA-C as  planned Schedule your shingles vaccine at the pharmacy.  Bone density referral has been sent and they will call you to schedule.  Please have your Gastroenterologist send [place] a letter regarding your colonoscopy.  Medicare wellness visit in one year. AVS printed and given to the patient.      Health Maintenance, Female Adopting a healthy lifestyle and getting preventive care are important in promoting health and wellness. Ask your health care provider about: The right schedule for you to have regular tests and exams. Things you can do on your own to prevent diseases and keep yourself healthy. What should I know about diet, weight, and exercise? Eat a healthy diet  Eat a diet that includes plenty of vegetables, fruits, low-fat dairy products, and lean protein. Do not eat a lot of foods that are high in solid fats, added sugars, or sodium. Maintain a healthy weight Body mass index (BMI) is used to identify weight problems. It estimates body fat based on height and weight. Your health care provider can help determine your BMI and help you achieve or maintain a healthy weight. Get regular exercise Get regular exercise. This is one of the most important things you can do for your health. Most adults should: Exercise for at least 150 minutes each week. The exercise should increase your heart rate and make you sweat (moderate-intensity exercise). Do strengthening exercises at least twice a week. This is in addition to the moderate-intensity exercise. Spend less time sitting. Even light physical activity can be beneficial. Watch cholesterol and blood  lipids Have your blood tested for lipids and cholesterol at 79 years of age, then have this test every 5 years. Have your cholesterol levels checked more often if: Your lipid or cholesterol levels are high. You are older than 79 years of age. You are at high risk for heart disease. What should I know about cancer screening? Depending on your  health history and family history, you may need to have cancer screening at various ages. This may include screening for: Breast cancer. Cervical cancer. Colorectal cancer. Skin cancer. Lung cancer. What should I know about heart disease, diabetes, and high blood pressure? Blood pressure and heart disease High blood pressure causes heart disease and increases the risk of stroke. This is more likely to develop in people who have high blood pressure readings or are overweight. Have your blood pressure checked: Every 3-5 years if you are 28-19 years of age. Every year if you are 66 years old or older. Diabetes Have regular diabetes screenings. This checks your fasting blood sugar level. Have the screening done: Once every three years after age 62 if you are at a normal weight and have a low risk for diabetes. More often and at a younger age if you are overweight or have a high risk for diabetes. What should I know about preventing infection? Hepatitis B If you have a higher risk for hepatitis B, you should be screened for this virus. Talk with your health care provider to find out if you are at risk for hepatitis B infection. Hepatitis C Testing is recommended for: Everyone born from [birth year] through 1963. Anyone with known risk factors for hepatitis C. Sexually transmitted infections (STIs) Get screened for STIs, including gonorrhea and chlamydia, if: You are sexually active and are younger than 79 years of age. You are older than 78 years of age and your health care provider tells you that you are at risk for this type of infection. Your sexual activity has changed since you were last screened, and you are at increased risk for chlamydia or gonorrhea. Ask your health care provider if you are at risk. Ask your health care provider about whether you are at high risk for HIV. Your health care provider may recommend a prescription medicine to help prevent HIV infection. If you choose to take  medicine to prevent HIV, you should first get tested for HIV. You should then be tested every 3 months for as long as you are taking the medicine. Pregnancy If you are about to stop having your period (premenopausal) and you may become pregnant, seek counseling before you get pregnant. Take 400 to 800 micrograms (mcg) of folic acid every day if you become pregnant. Ask for birth control (contraception) if you want to prevent pregnancy. Osteoporosis and menopause Osteoporosis is a disease in which the bones lose minerals and strength with aging. This can result in bone fractures. If you are 32 years old or older, or if you are at risk for osteoporosis and fractures, ask your health care provider if you should: Be screened for bone loss. Take a calcium or vitamin D supplement to lower your risk of fractures. Be given hormone replacement therapy (HRT) to treat symptoms of menopause. Follow these instructions at home: Alcohol use Do not drink alcohol if: Your health care provider tells you not to drink. You are pregnant, may be pregnant, or are planning to become pregnant. If you drink alcohol: Limit how much you have to: 0-1 drink a day. Know how much  alcohol is in your drink. In the [place] one drink equals one 12 oz bottle of beer (355 mL), one 5 oz glass of wine (148 mL), or one 1 oz glass of hard liquor (44 mL). Lifestyle Do not use any products that contain nicotine or tobacco. These products include cigarettes, chewing tobacco, and vaping devices, such as e-cigarettes. If you need help quitting, ask your health care provider. Do not use street drugs. Do not share needles. Ask your health care provider for help if you need support or information about quitting drugs. General instructions Schedule regular health, dental, and eye exams. Stay current with your vaccines. Tell your health care provider if: You often feel depressed. You have ever been abused or do not feel safe at  home. Summary Adopting a healthy lifestyle and getting preventive care are important in promoting health and wellness. Follow your health care provider's instructions about healthy diet, exercising, and getting tested or screened for diseases. Follow your health care provider's instructions on monitoring your cholesterol and blood pressure. This information is not intended to replace advice given to you by your health care provider. Make sure you discuss any questions you have with your health care provider. Document Revised: 04/10/2021 Document Reviewed: 04/10/2021 Elsevier Patient Education  [place].

## 2022-03-12 ENCOUNTER — Other Ambulatory Visit: Payer: Self-pay | Admitting: Physician Assistant

## 2022-03-20 DIAGNOSIS — Z20822 Contact with and (suspected) exposure to covid-19: Secondary | ICD-10-CM | POA: Diagnosis not present

## 2022-04-03 DIAGNOSIS — Z20822 Contact with and (suspected) exposure to covid-19: Secondary | ICD-10-CM | POA: Diagnosis not present

## 2022-04-10 ENCOUNTER — Other Ambulatory Visit: Payer: Self-pay | Admitting: Physician Assistant

## 2022-04-23 ENCOUNTER — Other Ambulatory Visit: Payer: Self-pay | Admitting: Physician Assistant

## 2022-05-29 ENCOUNTER — Telehealth: Payer: Self-pay | Admitting: Physician Assistant

## 2022-05-29 ENCOUNTER — Ambulatory Visit: Payer: Medicare Other | Admitting: Physician Assistant

## 2022-06-08 ENCOUNTER — Encounter: Payer: Self-pay | Admitting: Physician Assistant

## 2022-06-08 ENCOUNTER — Ambulatory Visit (INDEPENDENT_AMBULATORY_CARE_PROVIDER_SITE_OTHER): Payer: Medicare Other | Admitting: Physician Assistant

## 2022-06-08 VITALS — BP 120/65 | HR 80 | Ht 67.0 in | Wt 161.0 lb

## 2022-06-08 DIAGNOSIS — K5904 Chronic idiopathic constipation: Secondary | ICD-10-CM | POA: Diagnosis not present

## 2022-06-08 DIAGNOSIS — Z131 Encounter for screening for diabetes mellitus: Secondary | ICD-10-CM | POA: Diagnosis not present

## 2022-06-08 DIAGNOSIS — Z1231 Encounter for screening mammogram for malignant neoplasm of breast: Secondary | ICD-10-CM | POA: Diagnosis not present

## 2022-06-08 DIAGNOSIS — L814 Other melanin hyperpigmentation: Secondary | ICD-10-CM | POA: Diagnosis not present

## 2022-06-08 DIAGNOSIS — E782 Mixed hyperlipidemia: Secondary | ICD-10-CM | POA: Diagnosis not present

## 2022-06-08 DIAGNOSIS — N1831 Chronic kidney disease, stage 3a: Secondary | ICD-10-CM | POA: Diagnosis not present

## 2022-06-08 DIAGNOSIS — Z1329 Encounter for screening for other suspected endocrine disorder: Secondary | ICD-10-CM | POA: Diagnosis not present

## 2022-06-08 DIAGNOSIS — M8580 Other specified disorders of bone density and structure, unspecified site: Secondary | ICD-10-CM | POA: Diagnosis not present

## 2022-06-08 DIAGNOSIS — Z780 Asymptomatic menopausal state: Secondary | ICD-10-CM | POA: Diagnosis not present

## 2022-06-08 DIAGNOSIS — Z13820 Encounter for screening for osteoporosis: Secondary | ICD-10-CM | POA: Diagnosis not present

## 2022-06-08 DIAGNOSIS — E559 Vitamin D deficiency, unspecified: Secondary | ICD-10-CM

## 2022-06-08 DIAGNOSIS — Z79899 Other long term (current) drug therapy: Secondary | ICD-10-CM | POA: Diagnosis not present

## 2022-06-08 MED ORDER — LINACLOTIDE 145 MCG PO CAPS: 145.0000 ug | ORAL_CAPSULE | Freq: Every day | ORAL | 3 refills | Status: DC

## 2022-06-08 MED ORDER — PRAVASTATIN SODIUM 40 MG PO TABS: 40.0000 mg | ORAL_TABLET | Freq: Every day | ORAL | 3 refills | Status: DC

## 2022-06-08 NOTE — Patient Instructions (Addendum)
Get shingles at the pharmacy  Health Maintenance After Age 79 After age 76, you are at a higher risk for certain long-term diseases and infections as well as injuries from falls. Falls are a major cause of broken bones and head injuries in people who are older than age 24. Getting regular preventive care can help to keep you healthy and well. Preventive care includes getting regular testing and making lifestyle changes as recommended by your health care provider. Talk with your health care provider about: Which screenings and tests you should have. A screening is a test that checks for a disease when you have no symptoms. A diet and exercise plan that is right for you. What should I know about screenings and tests to prevent falls? Screening and testing are the best ways to find a health problem early. Early diagnosis and treatment give you the best chance of managing medical conditions that are common after age 56. Certain conditions and lifestyle choices may make you more likely to have a fall. Your health care provider may recommend: Regular vision checks. Poor vision and conditions such as cataracts can make you more likely to have a fall. If you wear glasses, make sure to get your prescription updated if your vision changes. Medicine review. Work with your health care provider to regularly review all of the medicines you are taking, including over-the-counter medicines. Ask your health care provider about any side effects that may make you more likely to have a fall. Tell your health care provider if any medicines that you take make you feel dizzy or sleepy. Strength and balance checks. Your health care provider may recommend certain tests to check your strength and balance while standing, walking, or changing positions. Foot health exam. Foot pain and numbness, as well as not wearing proper footwear, can make you more likely to have a fall. Screenings, including: Osteoporosis screening.  Osteoporosis is a condition that causes the bones to get weaker and break more easily. Blood pressure screening. Blood pressure changes and medicines to control blood pressure can make you feel dizzy. Depression screening. You may be more likely to have a fall if you have a fear of falling, feel depressed, or feel unable to do activities that you used to do. Alcohol use screening. Using too much alcohol can affect your balance and may make you more likely to have a fall. Follow these instructions at home: Lifestyle Do not drink alcohol if: Your health care provider tells you not to drink. If you drink alcohol: Limit how much you have to: 0-1 drink a day for women. 0-2 drinks a day for men. Know how much alcohol is in your drink. In the [place] one drink equals one 12 oz bottle of beer (355 mL), one 5 oz glass of wine (148 mL), or one 1 oz glass of hard liquor (44 mL). Do not use any products that contain nicotine or tobacco. These products include cigarettes, chewing tobacco, and vaping devices, such as e-cigarettes. If you need help quitting, ask your health care provider. Activity  Follow a regular exercise program to stay fit. This will help you maintain your balance. Ask your health care provider what types of exercise are appropriate for you. If you need a cane or walker, use it as recommended by your health care provider. Wear supportive shoes that have nonskid soles. Safety  Remove any tripping hazards, such as rugs, cords, and clutter. Install safety equipment such as grab bars in bathrooms and safety rails  on stairs. Keep rooms and walkways well-lit. General instructions Talk with your health care provider about your risks for falling. Tell your health care provider if: You fall. Be sure to tell your health care provider about all falls, even ones that seem minor. You feel dizzy, tiredness (fatigue), or off-balance. Take over-the-counter and prescription medicines only as told by  your health care provider. These include supplements. Eat a healthy diet and maintain a healthy weight. A healthy diet includes low-fat dairy products, low-fat (lean) meats, and fiber from whole grains, beans, and lots of fruits and vegetables. Stay current with your vaccines. Schedule regular health, dental, and eye exams. Summary Having a healthy lifestyle and getting preventive care can help to protect your health and wellness after age 44. Screening and testing are the best way to find a health problem early and help you avoid having a fall. Early diagnosis and treatment give you the best chance for managing medical conditions that are more common for people who are older than age 25. Falls are a major cause of broken bones and head injuries in people who are older than age 61. Take precautions to prevent a fall at home. Work with your health care provider to learn what changes you can make to improve your health and wellness and to prevent falls. This information is not intended to replace advice given to you by your health care provider. Make sure you discuss any questions you have with your health care provider. Document Revised: 04/10/2021 Document Reviewed: 04/10/2021 Elsevier Patient Education  [employer].

## 2022-06-08 NOTE — Progress Notes (Signed)
Established Patient Office Visit  Subjective   Patient ID: Gabrielle Torres, female    DOB: 1941-04-17  Age: 81 y.o. MRN: [id number]  Chief Complaint  Patient presents with   Follow-up    HPI Pt is a 81 yo female with Chronic Constipation, HLD, and CKD-3 who presents to the clinic for follow up.   Pt is doing well. She would like a darker area on her left side of face looked at. It does not itch or hurt. Not red or warm. It does seem to be getting a little darker and bigger. Not scaly.   Continues to have constipation issues. Has to take miralax daily. No abdominal pain. No melena or hematochezia.   Needs pravachol refilled for cholesterol. Pt is very active and walks every day. Pt denies any CP,palpitations, headaches or vision changes.   .. Active Ambulatory Problems    Diagnosis Date Noted   Vitamin D deficiency 12/28/2015   Hyperlipidemia 12/28/2015   Adenomatous polyp of colon 05/07/2016   Post-menopausal 05/09/2016   Piriformis syndrome of right side 05/09/2016   Hypertriglyceridemia 01/09/2017   Stage 3a chronic kidney disease ([place]) 02/06/2017   Idiopathic hypotension 01/14/2018   Itchy, watery, and red eye 01/14/2018   Age-related osteoporosis without current pathological fracture 01/13/2019   Osteopenia 02/03/2019   Urinary frequency 12/31/2019   Urinary urgency 12/31/2019   Atrophic vaginitis 12/31/2019   Sprain of medial collateral ligament of left knee 02/03/2021   Acute pain of left knee 02/03/2021   Acute medial meniscus tear, left, initial encounter 02/23/2021   Solar lentigo 06/08/2022   Chronic idiopathic constipation 06/08/2022   Resolved Ambulatory Problems    Diagnosis Date Noted   Mass of right wrist 04/15/2018   No Additional Past Medical History     ROS See HPI.    Objective:     BP 120/65   Pulse 80   Ht '5\' 7"'$  (1.702 m)   Wt 161 lb (73 kg)   SpO2 99%   BMI 25.22 kg/m  BP Readings from Last 3 Encounters:  06/08/22 120/65  01/19/22  116/62  02/03/21 (!) 120/49   Wt Readings from Last 3 Encounters:  06/08/22 161 lb (73 kg)  01/19/22 168 lb 1.9 oz (76.3 kg)  03/02/21 168 lb (76.2 kg)    .Marland Kitchen    01/19/2022   11:06 AM 11/23/2020    8:27 AM 11/23/2020    8:22 AM 11/23/2019    1:08 PM 02/03/2019    8:55 AM  Depression screen PHQ 2/9  Decreased Interest 0 0 0 0 0  Down, Depressed, Hopeless 0 0 0 0 0  PHQ - 2 Score 0 0 0 0 0  Altered sleeping     0  Tired, decreased energy     0  Change in appetite     0  Feeling bad or failure about yourself      0  Trouble concentrating     0  Moving slowly or fidgety/restless     0  Suicidal thoughts     0  PHQ-9 Score     0  Difficult doing work/chores     Not difficult at all       Physical Exam Constitutional:      Appearance: Normal appearance.  HENT:     Head: Normocephalic.  Neck:     Vascular: No carotid bruit.  Cardiovascular:     Rate and Rhythm: Normal rate and regular rhythm.  Pulses: Normal pulses.     Heart sounds: Normal heart sounds.  Pulmonary:     Effort: Pulmonary effort is normal.     Breath sounds: Normal breath sounds.  Musculoskeletal:     Cervical back: No tenderness.     Right lower leg: No edema.     Left lower leg: No edema.  Lymphadenopathy:     Cervical: No cervical adenopathy.  Skin:    Comments: Left cheek macular nickel sized slightly hyperpigmented irregular borders with no redness or scales.   Neurological:     General: No focal deficit present.     Mental Status: She is alert and oriented to person, place, and time.  Psychiatric:        Mood and Affect: Mood normal.       Assessment & Plan:  .Marland KitchenSyleena was seen today for follow-up.  Diagnoses and all orders for this visit:  Stage 3a chronic kidney disease ([place])  Vitamin D deficiency -     Vitamin D (25 hydroxy) -     DG Bone Density; Future  Mixed hyperlipidemia -     Lipid Panel w/reflex Direct LDL -     pravastatin (PRAVACHOL) 40 MG tablet; Take 1 tablet (40  mg total) by mouth daily.  Screening for diabetes mellitus -     COMPLETE METABOLIC PANEL WITH GFR  Thyroid disorder screen -     TSH  Medication management -     TSH -     Lipid Panel w/reflex Direct LDL -     COMPLETE METABOLIC PANEL WITH GFR -     CBC with Differential/Platelet -     Vitamin D (25 hydroxy)  Solar lentigo  Visit for screening mammogram -     MM 3D SCREEN BREAST BILATERAL; Future  Chronic idiopathic constipation -     linaclotide (LINZESS) 145 MCG CAPS capsule; Take 1 capsule (145 mcg total) by mouth daily before breakfast.  Osteopenia, unspecified location -     DG Bone Density; Future  Post-menopausal -     DG Bone Density; Future  Osteoporosis screening -     DG Bone Density; Future   Reviewed chart  Needs fasting labs Vitals look great! Needs shingles vaccine at pharmacy.  Needs mammogram. Ordered today.  Needs bone density. Ordered today.   Discussed and reassured about solar lentigo.   Cmp ordered to follow up on CKD.   Discussed constipation.  Stop miralax. Start linzess.  Follow up in 6 months.     Return in about 6 months (around 12/09/2022), or if symptoms worsen or fail to improve.    Iran Planas, PA-C

## 2022-06-11 ENCOUNTER — Telehealth: Payer: Self-pay

## 2022-06-11 DIAGNOSIS — Z79899 Other long term (current) drug therapy: Secondary | ICD-10-CM | POA: Diagnosis not present

## 2022-06-11 DIAGNOSIS — Z131 Encounter for screening for diabetes mellitus: Secondary | ICD-10-CM | POA: Diagnosis not present

## 2022-06-11 DIAGNOSIS — E559 Vitamin D deficiency, unspecified: Secondary | ICD-10-CM | POA: Diagnosis not present

## 2022-06-11 DIAGNOSIS — E782 Mixed hyperlipidemia: Secondary | ICD-10-CM | POA: Diagnosis not present

## 2022-06-11 DIAGNOSIS — Z1329 Encounter for screening for other suspected endocrine disorder: Secondary | ICD-10-CM | POA: Diagnosis not present

## 2022-06-11 MED ORDER — TRULANCE 3 MG PO TABS: 1.0000 | ORAL_TABLET | Freq: Every day | ORAL | 5 refills | Status: DC

## 2022-06-11 NOTE — Telephone Encounter (Signed)
Pt called and left vm in regards to her Linzess  being over priced  at 1800 out of pocket, and was asking if she should take apple juice or apple cider vinegar for constipation ,Called pt and advised she call her insurance to ask for the preferred medication within the same class and we would send a new script out to her, pt verbalized understanding.

## 2022-06-11 NOTE — Telephone Encounter (Signed)
Called patient and LVM letting her know new medication sent to pharmacy. To call back with questions or if this is too expensive.

## 2022-06-11 NOTE — Telephone Encounter (Signed)
Patient left vm after speaking with insurance. No alternatives to Linzess. Too expensive (1800$ month). Please advise next steps.

## 2022-06-11 NOTE — Telephone Encounter (Signed)
Let still try trulance 3 mg once a day just to see. I SENT TO PHARMACY.

## 2022-06-11 NOTE — Addendum Note (Signed)
Addended by: Donella Stade on: 06/11/2022 04:11 PM   Modules accepted: Orders

## 2022-06-12 ENCOUNTER — Other Ambulatory Visit: Payer: Self-pay | Admitting: Physician Assistant

## 2022-06-12 LAB — COMPLETE METABOLIC PANEL WITH GFR
AG Ratio: 2 (calc) (ref 1.0–2.5)
ALT: 12 U/L (ref 6–29)
AST: 17 U/L (ref 10–35)
Albumin: 4.4 g/dL (ref 3.6–5.1)
Alkaline phosphatase (APISO): 84 U/L (ref 37–153)
BUN: 13 mg/dL (ref 7–25)
CO2: 25 mmol/L (ref 20–32)
Calcium: 9.4 mg/dL (ref 8.6–10.4)
Chloride: 107 mmol/L (ref 98–110)
Creat: 0.91 mg/dL (ref 0.60–1.00)
Globulin: 2.2 g/dL (calc) (ref 1.9–3.7)
Glucose, Bld: 87 mg/dL (ref 65–99)
Potassium: 4.3 mmol/L (ref 3.5–5.3)
Sodium: 141 mmol/L (ref 135–146)
Total Bilirubin: 0.5 mg/dL (ref 0.2–1.2)
Total Protein: 6.6 g/dL (ref 6.1–8.1)
eGFR: 65 mL/min/{1.73_m2} (ref >=60)

## 2022-06-12 LAB — LIPID PANEL W/REFLEX DIRECT LDL
Cholesterol: 187 mg/dL (ref <200)
HDL: 69 mg/dL (ref >=50)
LDL Cholesterol (Calc): 88 mg/dL (calc)
Non-HDL Cholesterol (Calc): 118 mg/dL (calc) (ref <130)
Total CHOL/HDL Ratio: 2.7 (calc) (ref <5.0)
Triglycerides: 206 mg/dL — ABNORMAL HIGH (ref <150)

## 2022-06-12 LAB — CBC WITH DIFFERENTIAL/PLATELET
Absolute Monocytes: 448 cells/uL (ref 200–950)
Basophils Absolute: 32 cells/uL (ref 0–200)
Basophils Relative: 0.6 %
Eosinophils Absolute: 49 cells/uL (ref 15–500)
Eosinophils Relative: 0.9 %
HCT: 41.4 % (ref 35.0–45.0)
Hemoglobin: 13.7 g/dL (ref 11.7–15.5)
Lymphs Abs: 1366 cells/uL (ref 850–3900)
MCH: 31.4 pg (ref 27.0–33.0)
MCHC: 33.1 g/dL (ref 32.0–36.0)
MCV: 95 fL (ref 80.0–100.0)
MPV: 10.5 fL (ref 7.5–12.5)
Monocytes Relative: 8.3 %
Neutro Abs: 3505 cells/uL (ref 1500–7800)
Neutrophils Relative %: 64.9 %
Platelets: 212 10*3/uL (ref 140–400)
RBC: 4.36 10*6/uL (ref 3.80–5.10)
RDW: 13.1 % (ref 11.0–15.0)
Total Lymphocyte: 25.3 %
WBC: 5.4 10*3/uL (ref 3.8–10.8)

## 2022-06-12 LAB — VITAMIN D 25 HYDROXY (VIT D DEFICIENCY, FRACTURES): Vit D, 25-Hydroxy: 60 ng/mL (ref 30–100)

## 2022-06-12 LAB — TSH: TSH: 4.03 mIU/L (ref 0.40–4.50)

## 2022-06-12 NOTE — Telephone Encounter (Signed)
Can we get PA? OTC meds not helpful (miralax, stool softeners, etc) Linzess too expensive.

## 2022-06-12 NOTE — Progress Notes (Signed)
Oline,   Vitamin D looks great.  Hemoglobin good.  Kidney and liver look good.  Normal glucose.  TSH normal range and stable.  LDL and HDL look great.  TG a little elevated. Watch sugars/carbs and fried foods. Are you taking fish oil with pravachol?

## 2022-06-13 ENCOUNTER — Ambulatory Visit (INDEPENDENT_AMBULATORY_CARE_PROVIDER_SITE_OTHER): Payer: Medicare Other

## 2022-06-13 DIAGNOSIS — Z1231 Encounter for screening mammogram for malignant neoplasm of breast: Secondary | ICD-10-CM | POA: Diagnosis not present

## 2022-06-15 NOTE — Progress Notes (Signed)
Normal mammogram. Follow up in 1 year.

## 2022-06-15 NOTE — Telephone Encounter (Signed)
Patient called and left vm stating pharmacy needs approval through "[facility]". I imagine this needs a PA. Please add to grid to work on when you are able. Thanks.

## 2022-07-02 IMAGING — MR MR KNEE*L* W/O CM
6 series · 40 of 40 positions shown · non-contrast
Comparison: Plain films left knee 01/26/2021.

CLINICAL DATA: Pain and difficulty weight-bearing since the patient
suffered a left knee twisting injury performing [facility] 2-3 weeks
ago.

EXAM:
MRI OF THE LEFT KNEE WITHOUT CONTRAST
TECHNIQUE: Multiplanar, multisequence MR imaging of the knee was performed. No
intravenous contrast was administered.

[Series 3: T2 fat-sat · axial · 4.0mm · 0.47mm/px · z∈[-71,+54]mm · 6 of 26 slices shown (1 of 3)]
[im 1/26]
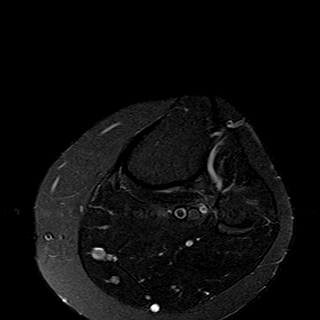
[im 6/26]
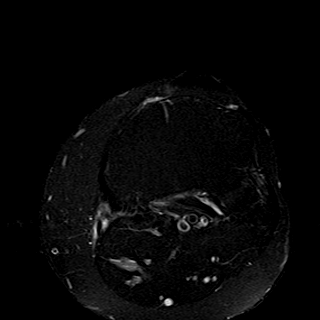
[im 11/26]
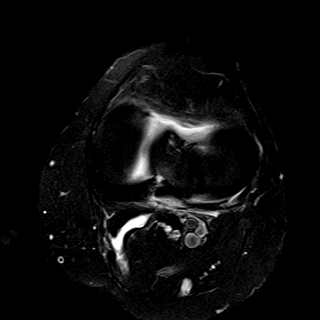
[im 16/26]
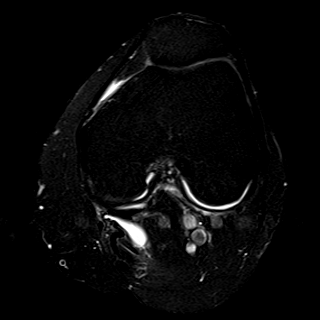
[im 21/26]
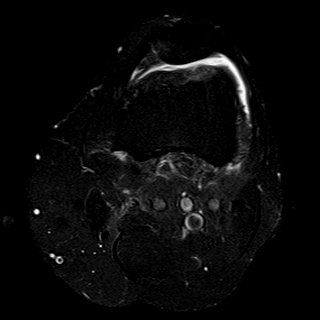
[im 26/26]
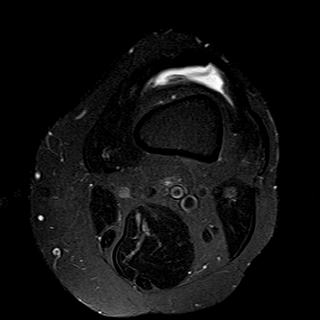

[Series 4: T1 · coronal · 4.0mm · 0.62mm/px · 7 of 28 slices shown]
[im 1/28]
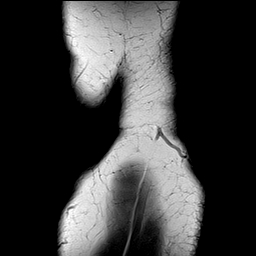
[im 5/28]
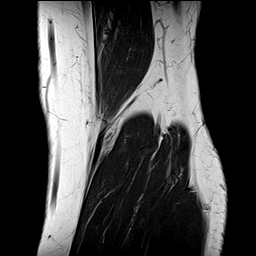
[im 10/28]
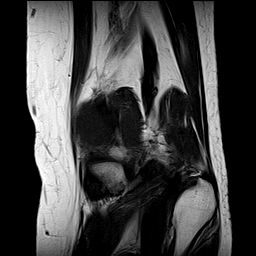
[im 14/28]
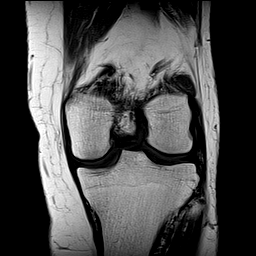
[im 19/28]
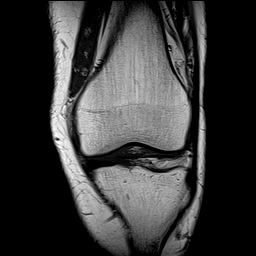
[im 23/28]
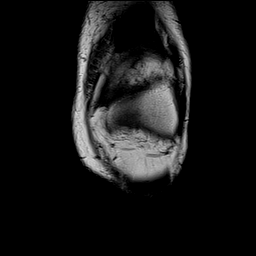
[im 28/28]
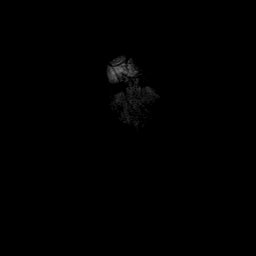

[Series 5: T2 fat-sat · coronal · 4.0mm · 0.50mm/px · 7 of 28 slices shown (2 of 3)]
[im 1/28]
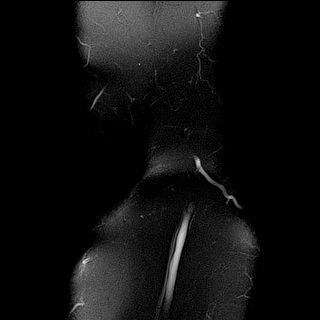
[im 5/28]
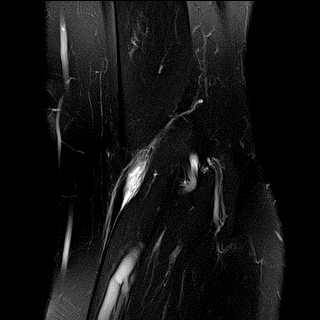
[im 10/28]
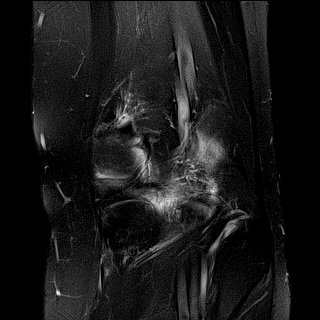
[im 14/28]
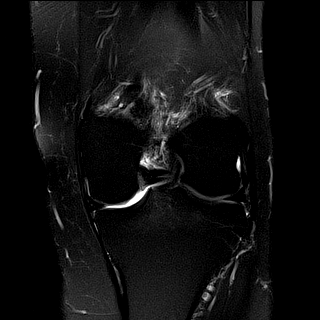
[im 19/28]
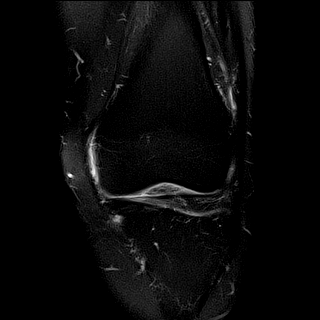
[im 23/28]
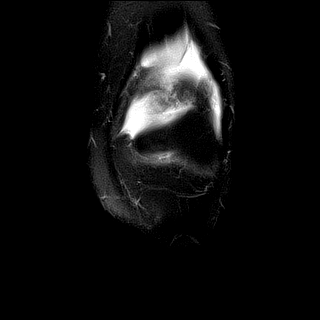
[im 28/28]
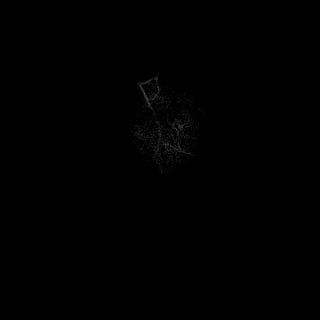

[Series 6: PD fat-sat · coronal · 3.0mm · 0.59mm/px · 8 of 35 slices shown (1 of 2)]
[im 1/35]
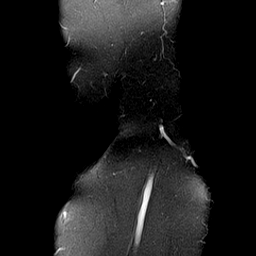
[im 5/35]
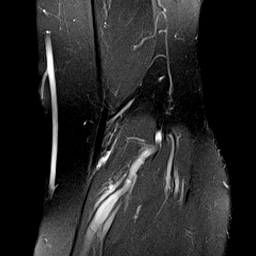
[im 10/35]
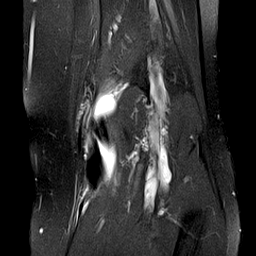
[im 15/35]
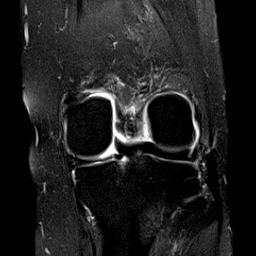
[im 20/35]
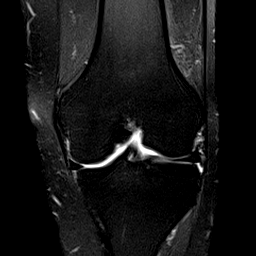
[im 25/35]
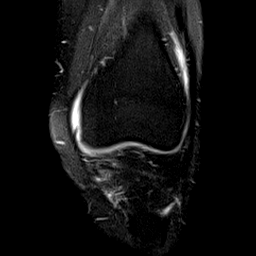
[im 30/35]
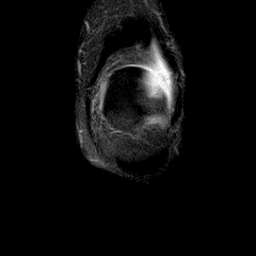
[im 35/35]
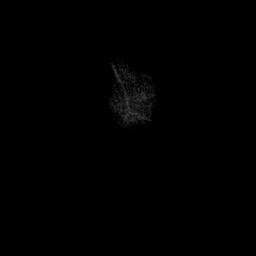

[Series 7: PD fat-sat · sagittal · 3.0mm · 0.62mm/px · 6 of 25 slices shown (2 of 2)]
[im 1/25]
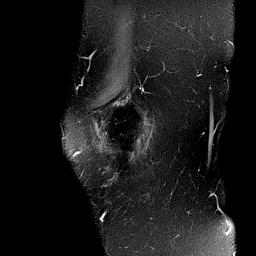
[im 5/25]
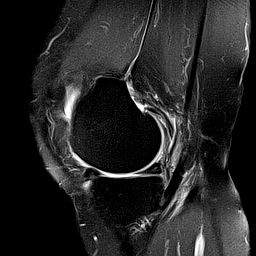
[im 10/25]
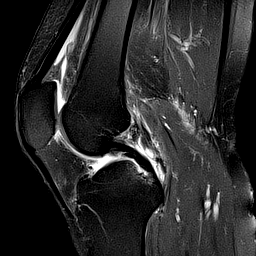
[im 15/25]
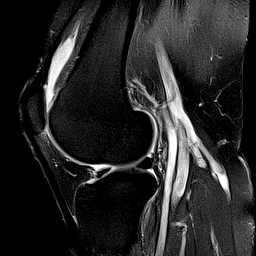
[im 20/25]
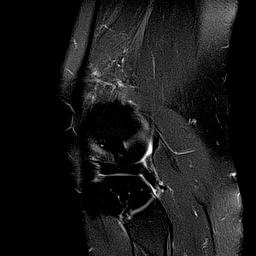
[im 25/25]
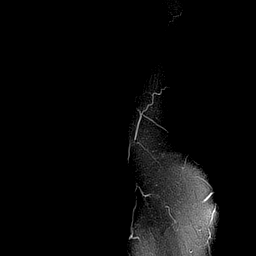

[Series 8: T2 fat-sat · sagittal · 3.0mm · 0.50mm/px · 6 of 25 slices shown (3 of 3)]
[im 1/25]
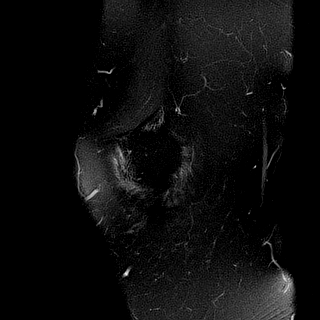
[im 5/25]
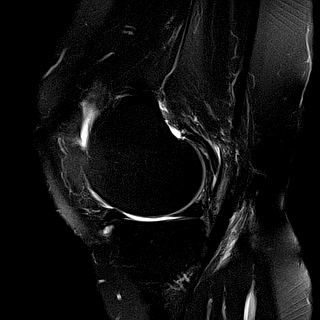
[im 10/25]
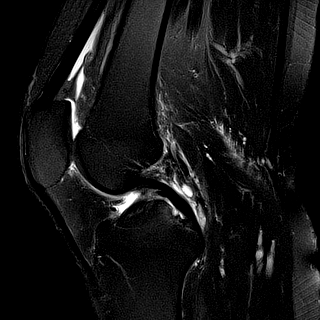
[im 15/25]
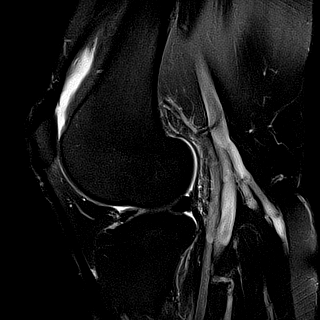
[im 20/25]
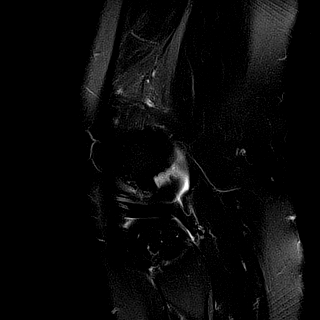
[im 25/25]
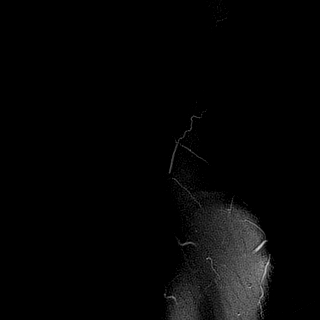

[40 of 40 positions shown; findings below may reference images not displayed]

FINDINGS: MENISCI

Medial meniscus: There is a nondisplaced complete radial tear
through the root of the posterior horn. Although motion limits
evaluation, there also appears to be a horizontal tear in the
posterior body reaching the meniscal undersurface.

Lateral meniscus:  Intact.

LIGAMENTS

Cruciates:  Intact.

Collaterals:  Intact.

CARTILAGE

Patellofemoral:  Normal.

Medial:  Normal.

Lateral:  Normal.

Joint:  Small effusion.

Popliteal Fossa: Baker's cyst by 0.8 cm AP by measures approximately
2.1 cm transverse 5 cm craniocaudal.

Extensor Mechanism:  Intact.

Bones:  No fracture, stress change or focal lesion.

Other: None.
IMPRESSION: Nondisplaced complete radial tear root of the posterior horn of the
medial meniscus. Motion somewhat limits evaluation but there also
appears to be a horizontal tear in the posterior body of the medial
meniscus reaching the undersurface.

Small Baker's cyst.

## 2022-07-12 ENCOUNTER — Telehealth: Payer: Self-pay

## 2022-07-12 NOTE — Telephone Encounter (Signed)
Initiated Prior authorization HTM:[id number] '3MG'$  tablets Via: Covermymeds Case/Key:[id number] Status: Pending as of 07/12/22 Reason: Notified Pt via: Mychart

## 2022-09-03 ENCOUNTER — Encounter: Payer: Self-pay | Admitting: Physician Assistant

## 2023-01-24 ENCOUNTER — Ambulatory Visit (INDEPENDENT_AMBULATORY_CARE_PROVIDER_SITE_OTHER): Payer: Medicare Other | Admitting: Physician Assistant

## 2023-01-24 VITALS — BP 98/63 | HR 79 | Ht 68.0 in | Wt 162.0 lb

## 2023-01-24 DIAGNOSIS — Z780 Asymptomatic menopausal state: Secondary | ICD-10-CM

## 2023-01-24 DIAGNOSIS — Z23 Encounter for immunization: Secondary | ICD-10-CM

## 2023-01-24 DIAGNOSIS — Z1231 Encounter for screening mammogram for malignant neoplasm of breast: Secondary | ICD-10-CM | POA: Diagnosis not present

## 2023-01-24 DIAGNOSIS — Z0000 Encounter for general adult medical examination without abnormal findings: Secondary | ICD-10-CM | POA: Diagnosis not present

## 2023-01-24 NOTE — Progress Notes (Signed)
MEDICARE ANNUAL WELLNESS VISIT  01/24/2023  Subjective:  Gabrielle Torres is a 82 y.o. female patient of Donella Stade, PA-C who had a [employer] Visit today. Gabrielle Torres is Retired and lives with their daughter. she has 3 children. she reports that she is socially active and does interact with friends/family regularly. she is moderately physically active and enjoys staying active, tai chi and playing a lot of games.  Patient Care Team: Lavada Mesi as PCP - General (Family Medicine)     01/24/2023    9:03 AM 01/19/2022   11:05 AM 11/23/2020    8:15 AM 11/23/2019    1:06 PM 11/17/2018    1:23 PM 12/28/2015    9:45 AM  Advanced Directives  Does Patient Have a Medical Advance Directive? Yes Yes No No No No  Type of Advance Directive Living will Living will;Healthcare Power of Attorney      Does patient want to make changes to medical advance directive? No - Patient declined No - Patient declined      Copy of [place] in Chart?  No - copy requested      Would patient like information on creating a medical advance directive?   No - Patient declined No - Patient declined No - Patient declined Yes - Educational materials given    Hospital Utilization Over the Past 12 Months: # of hospitalizations or ER visits: 0 # of surgeries: 0  Review of Systems    Patient reports that her overall health is better when compared to last year.  Review of Systems: History obtained from chart review and the patient  All other systems negative.  Pain Assessment Pain : No/denies pain     Current Medications & Allergies (verified) Allergies as of 01/24/2023   No Known Allergies      Medication List        Accurate as of January 24, 2023  9:25 AM. If you have any questions, ask your nurse or doctor.          aspirin 81 MG tablet Take 81 mg by mouth daily.   calcium-vitamin D 500-200 MG-UNIT tablet Commonly known as: OSCAL WITH D Take 1 tablet by  mouth.   FISH OIL PO Take 3,000 mg by mouth daily.   pravastatin 40 MG tablet Commonly known as: PRAVACHOL Take 1 tablet (40 mg total) by mouth daily.   Trulance 3 MG Tabs Generic drug: Plecanatide TAKE 1 TABLET BY MOUTH EVERY DAY   Vitamin D 125 MCG (5000 UT) Caps Take 5,000 Units by mouth daily.   VITAMIN E PO Take by mouth daily.        History (reviewed): Past Medical History:  Diagnosis Date   Hyperlipidemia    Past Surgical History:  Procedure Laterality Date   CHOLECYSTECTOMY     KNEE ARTHROSCOPY Left 01/2021   Family History  Problem Relation Age of Onset   Depression Father    Suicidality Father    Breast cancer Mother 78   Hypertension Son    Hypertension Paternal Aunt    Atrial fibrillation Daughter    Social History   Socioeconomic History   Marital status: Widowed    Spouse name: Not on file   Number of children: 3   Years of education: 67   Highest education level: 12th grade  Occupational History   Occupation: retired    Comment: housekeeper  Tobacco Use   Smoking status: Former   Smokeless tobacco: Never  Tobacco comments:    quit 50 years ago  Vaping Use   Vaping Use: Never used  Substance and Sexual Activity   Alcohol use: Yes    Alcohol/week: 1.0 standard drink of alcohol    Types: 1 Shots of liquor per week    Comment: Occasional   Drug use: No   Sexual activity: Not Currently    Birth control/protection: None, Post-menopausal  Other Topics Concern   Not on file  Social History Narrative   Does [place] [employer] 2 times a week. Plays cards with friends during the week. Helps take Care of daughters horse 2 times a month. Very active. Does a lot of games, reading. Lives with her daughter.    Social Determinants of Health   Financial Resource Strain: Low Risk  (01/24/2023)   Overall Financial Resource Strain (CARDIA)    Difficulty of Paying Living Expenses: Not hard at all  Food Insecurity: No Food Insecurity (01/24/2023)   Hunger  Vital Sign    Worried About Running Out of Food in the Last Year: Never true    Ran Out of Food in the Last Year: Never true  Transportation Needs: No Transportation Needs (01/24/2023)   PRAPARE - Hydrologist (Medical): No    Lack of Transportation (Non-Medical): No  Physical Activity: Sufficiently Active (01/24/2023)   Exercise Vital Sign    Days of Exercise per Week: 4 days    Minutes of Exercise per Session: 60 min  Stress: No Stress Concern Present (01/24/2023)   [place]    Feeling of Stress : Not at all  Social Connections: Moderately Isolated (01/24/2023)   Social Connection and Isolation Panel [NHANES]    Frequency of Communication with Friends and Family: More than three times a week    Frequency of Social Gatherings with Friends and Family: More than three times a week    Attends Religious Services: Never    Marine scientist or Organizations: Yes    Attends Music therapist: More than 4 times per year    Marital Status: Widowed    Activities of Daily Living    01/24/2023    9:06 AM  In your present state of health, do you have any difficulty performing the following activities:  Hearing? 0  Vision? 0  Difficulty concentrating or making decisions? 0  Walking or climbing stairs? 0  Dressing or bathing? 0  Doing errands, shopping? 0  Preparing Food and eating ? N  Using the Toilet? N  In the past six months, have you accidently leaked urine? N  Do you have problems with loss of bowel control? N  Managing your Medications? N  Managing your Finances? N  Housekeeping or managing your Housekeeping? N    Patient Education/Literacy How often do you need to have someone help you when you read instructions, pamphlets, or other written materials from your doctor or pharmacy?: 1 - Never What is the last grade level you completed in school?: 12th  grade  Exercise Current Exercise Habits: Structured exercise class, Type of exercise: Other - see comments (tai chi), Time (Minutes): 60, Frequency (Times/Week): 4, Weekly Exercise (Minutes/Week): 240, Intensity: Moderate, Exercise limited by: None identified  Diet Patient reports consuming 3 meals a day and 2 snack(s) a day Patient reports that her primary diet is: Regular Patient reports that she does have regular access to food.   Depression Screen    01/24/2023  9:03 AM 01/19/2022   11:06 AM 11/23/2020    8:27 AM 11/23/2020    8:22 AM 11/23/2019    1:08 PM 02/03/2019    8:55 AM 01/13/2019   10:28 AM  PHQ 2/9 Scores  PHQ - 2 Score 0 0 0 0 0 0 0  PHQ- 9 Score      0      Fall Risk    01/24/2023    9:03 AM 01/19/2022   11:06 AM 11/23/2020    8:27 AM 11/23/2019    1:07 PM 10/28/2019    9:41 AM  Fall Risk   Falls in the past year? 0 0 0 1 0  Comment     Emmi Telephone Survey: data to providers prior to load  Number falls in past yr: 0 0 0 0   Injury with Fall? 0 0 0 1   Comment    cracked rib from fall in kitchen   Risk for fall due to : No Fall Risks No Fall Risks No Fall Risks    Follow up Falls evaluation completed Falls evaluation completed Falls evaluation completed Falls prevention discussed      Objective:   BP 98/63   Pulse 79   Ht 5' 8"$  (1.727 m)   Wt 162 lb (73.5 kg)   SpO2 92%   BMI 24.63 kg/m   Last Weight  Most recent update: 01/24/2023  9:00 AM    Weight  73.5 kg (162 lb)             Body mass index is 24.63 kg/m.  Hearing/Vision  Gabrielle Torres did not have difficulty with hearing/understanding during the face-to-face interview Gabrielle Torres did not have difficulty with her vision during the face-to-face interview Reports that she has not had a formal eye exam by an eye care professional within the past year Reports that she has not had a formal hearing evaluation within the past year  Cognitive Function:    01/24/2023    9:08 AM 01/19/2022   11:17 AM  11/23/2020    8:19 AM 11/23/2019    1:14 PM 11/17/2018    1:30 PM  6CIT Screen  What Year? 0 points 0 points 0 points 0 points 0 points  What month? 0 points 0 points 0 points 0 points 0 points  What time? 0 points 0 points 0 points 0 points 0 points  Count back from 20 0 points 0 points 0 points 0 points 0 points  Months in reverse 0 points 0 points 2 points 0 points 2 points  Repeat phrase 0 points 0 points 2 points 0 points 0 points  Total Score 0 points 0 points 4 points 0 points 2 points    Normal Cognitive Function Screening: Yes (Normal:0-7, Significant for Dysfunction: >8)  Immunization & Health Maintenance Record Immunization History  Administered Date(s) Administered   Fluad Quad(high Dose 65+) 09/14/2019, 11/07/2020, 01/19/2022, 01/24/2023   Influenza, High Dose Seasonal PF 12/31/2017   Influenza,inj,Quad PF,6+ Mos 01/09/2017, 11/17/2018   PFIZER(Purple Top)SARS-COV-2 Vaccination 01/28/2020, 02/23/2020   Pneumococcal Conjugate-13 12/28/2015   Pneumococcal Polysaccharide-23 01/09/2017   Tdap 12/28/2015, 11/24/2017    Health Maintenance  Topic Date Due   COVID-19 Vaccine (3 - Pfizer risk series) 02/09/2023 (Originally 03/22/2020)   Zoster Vaccines- Shingrix (1 of 2) 04/24/2023 (Originally 07/15/1960)   COLONOSCOPY (Pts 45-42yr Insurance coverage will need to be confirmed)  01/25/2024 (Originally 07/05/2022)   Medicare Annual Wellness (AWV)  01/25/2024   MAMMOGRAM  06/13/2024   DTaP/Tdap/Td (  3 - Td or Tdap) 11/25/2027   Pneumonia Vaccine 52+ Years old  Completed   INFLUENZA VACCINE  Completed   DEXA SCAN  Completed   Hepatitis C Screening  Completed   HPV VACCINES  Aged Out       Assessment  This is a routine wellness examination for Gabrielle Torres.  Health Maintenance: Due or Overdue There are no preventive care reminders to display for this patient.   Gabrielle Torres does not need a referral for Community Assistance: Care Management:   no Social  Work:    no Prescription Assistance:  no Nutrition/Diabetes Education:  no   Plan:  Personalized Goals  Goals Addressed               This Visit's Progress     Patient Stated (pt-stated)        Patient would like to be more active and do more tai chi so she can travel in [place] next year.       Personalized Health Maintenance & Screening Recommendations  Influenza vaccine Screening mammography Bone densitometry screening Shingles vaccine.  Patient stated that she does not need any more colorectal cancer screening.   Lung Cancer Screening Recommended: no (Low Dose CT Chest recommended if Age 56-80 years, 30 pack-year currently smoking OR have quit w/in past 15 years) Hepatitis C Screening recommended: no HIV Screening recommended: no  Advanced Directives: Written information was not given per the patient's request.  Referrals & Orders Orders Placed This Encounter  Procedures   DEXAScan   Mammogram 3D SCREEN BREAST BILATERAL   Flu Vaccine QUAD High Dose(Fluad)    Follow-up Plan Follow-up with Donella Stade, PA-C as planned Schedule shingles vaccine at the pharmacy. Medicare wellness visit in one year.  AVS printed and given to the patient.   I have personally reviewed and noted the following in the patient's chart:   Medical and social history Use of alcohol, tobacco or illicit drugs  Current medications and supplements Functional ability and status Nutritional status Physical activity Advanced directives List of other physicians Hospitalizations, surgeries, and ER visits in previous 12 months Vitals Screenings to include cognitive, depression, and falls Referrals and appointments  In addition, I have reviewed and discussed with patient certain preventive protocols, quality metrics, and best practice recommendations. A written personalized care plan for preventive services as well as general preventive health recommendations were provided to patient.      Tinnie Gens, RN BSN  01/24/2023

## 2023-01-24 NOTE — Patient Instructions (Addendum)
[place] Maintenance Summary and Written Plan of Care  Ms. Sheeley ,  Thank you for allowing me to perform your Medicare Annual Wellness Visit and for your ongoing commitment to your health.   Health Maintenance & Immunization History Health Maintenance  Topic Date Due   COVID-19 Vaccine (3 - Pfizer risk series) 02/09/2023 (Originally 03/22/2020)   Zoster Vaccines- Shingrix (1 of 2) 04/24/2023 (Originally 07/15/1960)   COLONOSCOPY (Pts 45-70yr Insurance coverage will need to be confirmed)  01/25/2024 (Originally 07/05/2022)   Medicare Annual Wellness (AWV)  01/25/2024   MAMMOGRAM  06/13/2024   DTaP/Tdap/Td (3 - Td or Tdap) 11/25/2027   Pneumonia Vaccine 80 Years old  Completed   INFLUENZA VACCINE  Completed   DEXA SCAN  Completed   Hepatitis C Screening  Completed   HPV VACCINES  Aged Out   Immunization History  Administered Date(s) Administered   Fluad Quad(high Dose 65+) 09/14/2019, 11/07/2020, 01/19/2022, 01/24/2023   Influenza, High Dose Seasonal PF 12/31/2017   Influenza,inj,Quad PF,6+ Mos 01/09/2017, 11/17/2018   PFIZER(Purple Top)SARS-COV-2 Vaccination 01/28/2020, 02/23/2020   Pneumococcal Conjugate-13 12/28/2015   Pneumococcal Polysaccharide-23 01/09/2017   Tdap 12/28/2015, 11/24/2017    These are the patient goals that we discussed:  Goals Addressed               This Visit's Progress     Patient Stated (pt-stated)        Patient would like to be more active and do more tai chi [place]         This is a list of Health Maintenance Items that are overdue or due now:  Influenza vaccine Screening mammography Bone densitometry screening Shingles vaccine.  Patient stated that she does not need any more colorectal cancer screening.   Orders/Referrals Placed Today: Orders Placed This Encounter  Procedures   DEXAScan    Standing Status:   Future    Standing Expiration Date:   01/25/2024     Scheduling Instructions:     Please call patient to schedule.    Order Specific Question:   Reason for exam:    Answer:   post menopausal    Order Specific Question:   Preferred imaging location?    [facility]  Mammogram 3D SCREEN BREAST BILATERAL    Standing Status:   Future    Standing Expiration Date:   01/25/2024    Scheduling Instructions:     Please call patient to schedule. Last mammo was  on 06/13/22.    Order Specific Question:   Reason for Exam (SYMPTOM  OR DIAGNOSIS REQUIRED)    Answer:   breast cancer screening    Order Specific Question:   Preferred imaging location?    [facility]  Flu Vaccine QUAD High Dose(Fluad)    (Contact our referral department at [phone] you have not spoken with someone about your referral appointment within the next 5 days)    Follow-up Plan Follow-up with BDonella Stade PA-C as planned Schedule shingles vaccine at the pharmacy. Medicare wellness visit in one year.  AVS printed and given to the patient.      Health Maintenance, Female Adopting a healthy lifestyle and getting preventive care are important in promoting health and wellness. Ask your health care provider about: The right schedule for you to have regular tests and exams. Things you can do on your own to prevent diseases and keep  yourself healthy. What should I know about diet, weight, and exercise? Eat a healthy diet  Eat a diet that includes plenty of vegetables, fruits, low-fat dairy products, and lean protein. Do not eat a lot of foods that are high in solid fats, added sugars, or sodium. Maintain a healthy weight Body mass index (BMI) is used to identify weight problems. It estimates body fat based on height and weight. Your health care provider can help determine your BMI and help you achieve or maintain a healthy weight. Get regular exercise Get regular exercise. This is one of the most important things you can do  for your health. Most adults should: Exercise for at least 150 minutes each week. The exercise should increase your heart rate and make you sweat (moderate-intensity exercise). Do strengthening exercises at least twice a week. This is in addition to the moderate-intensity exercise. Spend less time sitting. Even light physical activity can be beneficial. Watch cholesterol and blood lipids Have your blood tested for lipids and cholesterol at 80 years of age, then have this test every 5 years. Have your cholesterol levels checked more often if: Your lipid or cholesterol levels are high. You are older than 80 years of age. You are at high risk for heart disease. What should I know about cancer screening? Depending on your health history and family history, you may need to have cancer screening at various ages. This may include screening for: Breast cancer. Cervical cancer. Colorectal cancer. Skin cancer. Lung cancer. What should I know about heart disease, diabetes, and high blood pressure? Blood pressure and heart disease High blood pressure causes heart disease and increases the risk of stroke. This is more likely to develop in people who have high blood pressure readings or are overweight. Have your blood pressure checked: Every 3-5 years if you are 19-89 years of age. Every year if you are 64 years old or older. Diabetes Have regular diabetes screenings. This checks your fasting blood sugar level. Have the screening done: Once every three years after age 23 if you are at a normal weight and have a low risk for diabetes. More often and at a younger age if you are overweight or have a high risk for diabetes. What should I know about preventing infection? Hepatitis B If you have a higher risk for hepatitis B, you should be screened for this virus. Talk with your health care provider to find out if you are at risk for hepatitis B infection. Hepatitis C Testing is recommended for: Everyone  born from [birth year] through 1965. Anyone with known risk factors for hepatitis C. Sexually transmitted infections (STIs) Get screened for STIs, including gonorrhea and chlamydia, if: You are sexually active and are younger than 80 years of age. You are older than 80 years of age and your health care provider tells you that you are at risk for this type of infection. Your sexual activity has changed since you were last screened, and you are at increased risk for chlamydia or gonorrhea. Ask your health care provider if you are at risk. Ask your health care provider about whether you are at high risk for HIV. Your health care provider may recommend a prescription medicine to help prevent HIV infection. If you choose to take medicine to prevent HIV, you should first get tested for HIV. You should then be tested every 3 months for as long as you are taking the medicine. Pregnancy If you are about to stop having your period (premenopausal) and  you may become pregnant, seek counseling before you get pregnant. Take 400 to 800 micrograms (mcg) of folic acid every day if you become pregnant. Ask for birth control (contraception) if you want to prevent pregnancy. Osteoporosis and menopause Osteoporosis is a disease in which the bones lose minerals and strength with aging. This can result in bone fractures. If you are 71 years old or older, or if you are at risk for osteoporosis and fractures, ask your health care provider if you should: Be screened for bone loss. Take a calcium or vitamin D supplement to lower your risk of fractures. Be given hormone replacement therapy (HRT) to treat symptoms of menopause. Follow these instructions at home: Alcohol use Do not drink alcohol if: Your health care provider tells you not to drink. You are pregnant, may be pregnant, or are planning to become pregnant. If you drink alcohol: Limit how much you have to: 0-1 drink a day. Know how much alcohol is in your drink. In  the [place] one drink equals one 12 oz bottle of beer (355 mL), one 5 oz glass of wine (148 mL), or one 1 oz glass of hard liquor (44 mL). Lifestyle Do not use any products that contain nicotine or tobacco. These products include cigarettes, chewing tobacco, and vaping devices, such as e-cigarettes. If you need help quitting, ask your health care provider. Do not use street drugs. Do not share needles. Ask your health care provider for help if you need support or information about quitting drugs. General instructions Schedule regular health, dental, and eye exams. Stay current with your vaccines. Tell your health care provider if: You often feel depressed. You have ever been abused or do not feel safe at home. Summary Adopting a healthy lifestyle and getting preventive care are important in promoting health and wellness. Follow your health care provider's instructions about healthy diet, exercising, and getting tested or screened for diseases. Follow your health care provider's instructions on monitoring your cholesterol and blood pressure. This information is not intended to replace advice given to you by your health care provider. Make sure you discuss any questions you have with your health care provider. Document Revised: 04/10/2021 Document Reviewed: 04/10/2021 Elsevier Patient Education  [employer] [place].

## 2023-03-07 DIAGNOSIS — R9431 Abnormal electrocardiogram [ECG] [EKG]: Secondary | ICD-10-CM | POA: Diagnosis not present

## 2023-03-07 DIAGNOSIS — Z79899 Other long term (current) drug therapy: Secondary | ICD-10-CM | POA: Diagnosis not present

## 2023-03-07 DIAGNOSIS — G459 Transient cerebral ischemic attack, unspecified: Secondary | ICD-10-CM | POA: Diagnosis not present

## 2023-03-07 DIAGNOSIS — F419 Anxiety disorder, unspecified: Secondary | ICD-10-CM | POA: Diagnosis not present

## 2023-03-07 DIAGNOSIS — R251 Tremor, unspecified: Secondary | ICD-10-CM | POA: Diagnosis not present

## 2023-03-07 DIAGNOSIS — I4510 Unspecified right bundle-branch block: Secondary | ICD-10-CM | POA: Diagnosis not present

## 2023-03-07 DIAGNOSIS — R4182 Altered mental status, unspecified: Secondary | ICD-10-CM | POA: Diagnosis not present

## 2023-03-11 ENCOUNTER — Telehealth: Payer: Self-pay | Admitting: General Practice

## 2023-03-11 ENCOUNTER — Encounter: Payer: Self-pay | Admitting: Family Medicine

## 2023-03-11 ENCOUNTER — Ambulatory Visit (INDEPENDENT_AMBULATORY_CARE_PROVIDER_SITE_OTHER): Payer: Medicare Other | Admitting: Family Medicine

## 2023-03-11 VITALS — BP 122/67 | HR 68 | Temp 97.7°F | Ht 68.0 in | Wt 159.1 lb

## 2023-03-11 DIAGNOSIS — F419 Anxiety disorder, unspecified: Secondary | ICD-10-CM | POA: Diagnosis not present

## 2023-03-11 NOTE — Patient Instructions (Signed)
[id number] grounding exercise for anxiety

## 2023-03-11 NOTE — Progress Notes (Signed)
   Acute Office Visit  Subjective:     Patient ID: Gabrielle Torres, female    DOB: 14-Jul-1941, 82 y.o.   MRN: [id number]  Chief Complaint  Patient presents with   Anxiety    Follow up from ER. Pt states that she is feeling better.    HPI Patient is in today for ED follow up. She was seen for anxiety and likely panic attack and was prescribed xanax TID prn. She has not yet taken this medication as she was worried about it interfering with her kidneys.   Review of Systems  Constitutional:  Negative for chills and fever.  Respiratory:  Negative for cough and shortness of breath.   Cardiovascular:  Negative for chest pain.  Neurological:  Negative for headaches.       Objective:    BP 122/67   Pulse 68   Temp 97.7 F (36.5 C) (Oral)   Ht 5\' 8"  (1.727 m)   Wt 159 lb 1.9 oz (72.2 kg)   SpO2 100%   BMI 24.19 kg/m    Physical Exam Vitals and nursing note reviewed.  Constitutional:      General: She is not in acute distress.    Appearance: Normal appearance.  HENT:     Head: Normocephalic and atraumatic.     Right Ear: External ear normal.     Left Ear: External ear normal.     Nose: Nose normal.  Eyes:     Conjunctiva/sclera: Conjunctivae normal.  Cardiovascular:     Rate and Rhythm: Normal rate and regular rhythm.  Pulmonary:     Effort: Pulmonary effort is normal.     Breath sounds: Normal breath sounds.  Neurological:     General: No focal deficit present.     Mental Status: She is alert and oriented to person, place, and time.  Psychiatric:        Mood and Affect: Mood normal.        Behavior: Behavior normal.        Thought Content: Thought content normal.        Judgment: Judgment normal.     No results found for any visits on 03/11/23.      Assessment & Plan:   Problem List Items Addressed This Visit       Other   Anxiety - Primary    - sounds like pt had panic attack. We discussed [phone] grounding exercise as well as risks of taking xanax. She did  not take xanax and I advised against it. We also discussed ssris and buspar as options, but will hold off for now. We discussed therapy but patient will hold off - it seems to be related to caffeine consumption-discussed decreasing coffee or taking with food        No orders of the defined types were placed in this encounter.   Return if symptoms worsen or fail to improve.  Charlton Amor, DO

## 2023-03-11 NOTE — Transitions of Care (Post Inpatient/ED Visit) (Signed)
   03/11/2023  Name: Gabrielle Torres MRN: [id number] DOB: Aug 05, 1941  Today's TOC FU Call Status: Today's TOC FU Call Status:: Unsuccessul Call (1st Attempt) Unsuccessful Call (1st Attempt) Date: 03/11/23  Attempted to reach the patient regarding the most recent Inpatient/ED visit.  Follow Up Plan: Additional outreach attempts will be made to reach the patient to complete the Transitions of Care (Post Inpatient/ED visit) call.   Signature [place]esto Charon, RN BSN

## 2023-03-11 NOTE — Addendum Note (Signed)
Addended by: Charlton Amor on: 03/11/2023 03:54 PM   Modules accepted: Level of Service

## 2023-03-11 NOTE — Assessment & Plan Note (Signed)
-   sounds like pt had panic attack. We discussed [phone] grounding exercise as well as risks of taking xanax. She did not take xanax and I advised against it. We also discussed ssris and buspar as options, but will hold off for now. We discussed therapy but patient will hold off - it seems to be related to caffeine consumption-discussed decreasing coffee or taking with food

## 2023-03-12 NOTE — Transitions of Care (Post Inpatient/ED Visit) (Signed)
   03/12/2023  Name: Gabrielle Torres MRN: [id number] DOB: 09-19-1941  Today's TOC FU Call Status: Today's TOC FU Call Status:: Successful TOC FU Call Competed Unsuccessful Call (1st Attempt) Date: 03/11/23 [facility] FU Call Complete Date: 03/12/23  Patient had OV with Dr. Tamera Punt yesterday.  SIGNATURE Modesto Charon, RN BSN

## 2023-03-18 ENCOUNTER — Ambulatory Visit: Payer: PRIVATE HEALTH INSURANCE | Admitting: Physician Assistant

## 2023-06-01 ENCOUNTER — Other Ambulatory Visit: Payer: Self-pay | Admitting: Physician Assistant

## 2023-06-01 DIAGNOSIS — E782 Mixed hyperlipidemia: Secondary | ICD-10-CM

## 2023-06-04 ENCOUNTER — Other Ambulatory Visit: Payer: Self-pay | Admitting: Physician Assistant

## 2023-06-04 DIAGNOSIS — Z13820 Encounter for screening for osteoporosis: Secondary | ICD-10-CM

## 2023-06-04 DIAGNOSIS — Z780 Asymptomatic menopausal state: Secondary | ICD-10-CM

## 2023-06-04 DIAGNOSIS — E559 Vitamin D deficiency, unspecified: Secondary | ICD-10-CM

## 2023-06-04 DIAGNOSIS — M8580 Other specified disorders of bone density and structure, unspecified site: Secondary | ICD-10-CM

## 2023-06-19 ENCOUNTER — Ambulatory Visit: Payer: Medicare Other

## 2023-06-19 DIAGNOSIS — Z780 Asymptomatic menopausal state: Secondary | ICD-10-CM | POA: Diagnosis not present

## 2023-06-19 DIAGNOSIS — Z1231 Encounter for screening mammogram for malignant neoplasm of breast: Secondary | ICD-10-CM | POA: Diagnosis not present

## 2023-06-19 DIAGNOSIS — M810 Age-related osteoporosis without current pathological fracture: Secondary | ICD-10-CM | POA: Diagnosis not present

## 2023-06-19 DIAGNOSIS — Z0000 Encounter for general adult medical examination without abnormal findings: Secondary | ICD-10-CM

## 2023-06-19 NOTE — Progress Notes (Signed)
Bone density worsened with a T-score of -3.0 from -2.3. I would like to encourage you to consider a few years of bone density medication. Thoughts?

## 2023-06-24 NOTE — Progress Notes (Signed)
Schedule virtual visit to discuss. 2 medications I use the most frequent is prolia and/or fosamax.

## 2023-06-24 NOTE — Progress Notes (Signed)
Normal mammogram. Follow up in 1 year.

## 2023-06-28 ENCOUNTER — Encounter: Payer: Self-pay | Admitting: Physician Assistant

## 2023-06-28 ENCOUNTER — Ambulatory Visit (INDEPENDENT_AMBULATORY_CARE_PROVIDER_SITE_OTHER): Payer: Medicare Other | Admitting: Physician Assistant

## 2023-06-28 VITALS — BP 104/60 | HR 71 | Ht 68.0 in | Wt 159.0 lb

## 2023-06-28 DIAGNOSIS — M810 Age-related osteoporosis without current pathological fracture: Secondary | ICD-10-CM

## 2023-06-28 NOTE — Progress Notes (Signed)
   Established Patient Office Visit  Subjective   Patient ID: Gabrielle Torres, female    DOB: 11-07-1941  Age: 83 y.o. MRN: [id number]  Chief Complaint  Patient presents with   Osteoporosis    HPI Pt is a 82 yo female who presents to the clinic to discuss DEXA scan.   2022 T score -2.3.  2024 T score -3.0.    Marland Kitchen. Active Ambulatory Problems    Diagnosis Date Noted   Vitamin D deficiency 12/28/2015   Hyperlipidemia 12/28/2015   Adenomatous polyp of colon 05/07/2016   Post-menopausal 05/09/2016   Piriformis syndrome of right side 05/09/2016   Hypertriglyceridemia 01/09/2017   Stage 3a chronic kidney disease (HCC) 02/06/2017   Idiopathic hypotension 01/14/2018   Itchy, watery, and red eye 01/14/2018   Age-related osteoporosis without current pathological fracture 01/13/2019   Osteopenia 02/03/2019   Urinary frequency 12/31/2019   Urinary urgency 12/31/2019   Atrophic vaginitis 12/31/2019   Sprain of medial collateral ligament of left knee 02/03/2021   Acute pain of left knee 02/03/2021   Acute medial meniscus tear, left, initial encounter 02/23/2021   Solar lentigo 06/08/2022   Chronic idiopathic constipation 06/08/2022   Anxiety 03/11/2023   Resolved Ambulatory Problems    Diagnosis Date Noted   Mass of right wrist 04/15/2018   No Additional Past Medical History     Review of Systems  All other systems reviewed and are negative.     Objective:     Ht 5\' 8"  (1.727 m)   BMI 24.19 kg/m  BP Readings from Last 3 Encounters:  03/11/23 122/67  01/24/23 98/63  06/08/22 120/65   Wt Readings from Last 3 Encounters:  03/11/23 159 lb 1.9 oz (72.2 kg)  01/24/23 162 lb (73.5 kg)  06/08/22 161 lb (73 kg)      Physical Exam Constitutional:      Appearance: Normal appearance.  Cardiovascular:     Rate and Rhythm: Normal rate.  Pulmonary:     Effort: Pulmonary effort is normal.  Neurological:     General: No focal deficit present.     Mental Status: She is alert  and oriented to person, place, and time.  Psychiatric:        Mood and Affect: Mood normal.       The 10-year ASCVD risk score (Arnett DK, et al., 2019) is: 23.1%    Assessment & Plan:  .Marland KitchenAkeelah was seen today for osteoporosis.  Diagnoses and all orders for this visit:  Age-related osteoporosis without current pathological fracture   Discussed osteoporosis dx Pt is on vitamin D 5000 units daily and 1200mg  of calcium  She will remain on this and asked her to consider adding fosamax vs prolia vs Actonel.  Gave HO and discussed risk vs benefits of medication Pt will consider and let me know Discussed importance of diet and exercise HO given  Spent 25 minutes with patient discussing bone density results, medications and plan.   Tandy Gaw, PA-C

## 2023-06-28 NOTE — Patient Instructions (Signed)

## 2023-07-01 ENCOUNTER — Telehealth: Payer: Self-pay | Admitting: Physician Assistant

## 2023-07-01 NOTE — Telephone Encounter (Signed)
Patient called in to give the medication that she would like to take based on what was offered during her last appt. She is requesting Fosamax tablets.

## 2023-07-02 MED ORDER — ALENDRONATE SODIUM 70 MG PO TABS: 70.0000 mg | ORAL_TABLET | ORAL | 11 refills | Status: DC

## 2023-07-02 NOTE — Telephone Encounter (Signed)
Medication has been sent to pharmacy.  °

## 2023-07-12 DIAGNOSIS — H524 Presbyopia: Secondary | ICD-10-CM | POA: Diagnosis not present

## 2023-07-12 DIAGNOSIS — H2513 Age-related nuclear cataract, bilateral: Secondary | ICD-10-CM | POA: Diagnosis not present

## 2023-11-01 ENCOUNTER — Ambulatory Visit
Admission: EM | Admit: 2023-11-01 | Discharge: 2023-11-01 | Disposition: A | Discharge status: Home / Self Care | Payer: Medicare Other | Attending: Family Medicine | Admitting: Family Medicine

## 2023-11-01 DIAGNOSIS — R059 Cough, unspecified: Secondary | ICD-10-CM | POA: Diagnosis not present

## 2023-11-01 DIAGNOSIS — J069 Acute upper respiratory infection, unspecified: Secondary | ICD-10-CM | POA: Diagnosis not present

## 2023-11-01 MED ORDER — BENZONATATE 200 MG PO CAPS: 200.0000 mg | ORAL_CAPSULE | Freq: Three times a day (TID) | ORAL | 0 refills | Status: AC | PRN

## 2023-11-01 MED ORDER — DOXYCYCLINE HYCLATE 100 MG PO CAPS: 100.0000 mg | ORAL_CAPSULE | Freq: Two times a day (BID) | ORAL | 0 refills | Status: AC

## 2023-11-01 MED ORDER — PREDNISONE 20 MG PO TABS: ORAL_TABLET | ORAL | 0 refills | Status: DC

## 2023-11-01 NOTE — ED Provider Notes (Signed)
[facility]    CSN: [id number] Arrival date & time: 11/01/23  0854      History   Chief Complaint Chief Complaint  Patient presents with   Cough   Nasal Congestion    HPI Gabrielle Torres is a 82 y.o. female.   HPI pleasant 82 year old female presents with cough and congestion for 1 week.  Reports history of bronchitis and pneumonia.  PMH significant for chronic idiopathic constipation, solar lentigo, and osteopenia.  Past Medical History:  Diagnosis Date   Hyperlipidemia     Patient Active Problem List   Diagnosis Date Noted   Anxiety 03/11/2023   Solar lentigo 06/08/2022   Chronic idiopathic constipation 06/08/2022   Acute medial meniscus tear, left, initial encounter 02/23/2021   Sprain of medial collateral ligament of left knee 02/03/2021   Acute pain of left knee 02/03/2021   Urinary frequency 12/31/2019   Urinary urgency 12/31/2019   Atrophic vaginitis 12/31/2019   Osteopenia 02/03/2019   Age-related osteoporosis without current pathological fracture 01/13/2019   Idiopathic hypotension 01/14/2018   Itchy, watery, and red eye 01/14/2018   Stage 3a chronic kidney disease (HCC) 02/06/2017   Hypertriglyceridemia 01/09/2017   Post-menopausal 05/09/2016   Piriformis syndrome of right side 05/09/2016   Adenomatous polyp of colon 05/07/2016   Vitamin D deficiency 12/28/2015   Hyperlipidemia 12/28/2015    Past Surgical History:  Procedure Laterality Date   CHOLECYSTECTOMY     KNEE ARTHROSCOPY Left 01/2021    OB History     Gravida  3   Para      Term      Preterm      AB      Living  3      SAB      IAB      Ectopic      Multiple      Live Births  3            Home Medications    Prior to Admission medications   Medication Sig Start Date End Date Taking? Authorizing Provider  benzonatate (TESSALON) 200 MG capsule Take 1 capsule (200 mg total) by mouth 3 (three) times daily as needed for up to 7 days. 11/01/23 11/08/23 Yes  Trevor Iha, FNP  doxycycline (VIBRAMYCIN) 100 MG capsule Take 1 capsule (100 mg total) by mouth 2 (two) times daily for 7 days. 11/01/23 11/08/23 Yes Trevor Iha, FNP  predniSONE (DELTASONE) 20 MG tablet Take 3 tabs PO daily x 5 days. 11/01/23  Yes Trevor Iha, FNP  alendronate (FOSAMAX) 70 MG tablet Take 1 tablet (70 mg total) by mouth every 7 (seven) days. Take with a full glass of water on an empty stomach. 07/02/23   Jomarie Longs, PA-C  aspirin 81 MG tablet Take 81 mg by mouth daily.    [provider]  Cholecalciferol (VITAMIN D) 125 MCG (5000 UT) CAPS Take 5,000 Units by mouth daily.    [provider]  Omega-3 Fatty Acids (FISH OIL PO) Take 3,000 mg by mouth daily.    [provider]  pravastatin (PRAVACHOL) 40 MG tablet TAKE 1 TABLET BY MOUTH EVERY DAY 06/03/23   Breeback, Jade L, PA-C  VITAMIN E PO Take by mouth daily.    [provider]    Family History Family History  Problem Relation Age of Onset   Depression Father    Suicidality Father    Breast cancer Mother 30   Hypertension Son    Hypertension  Paternal Aunt    Atrial fibrillation Daughter     Social History Social History   Tobacco Use   Smoking status: Former   Smokeless tobacco: Never   Tobacco comments:    quit 50 years ago  Vaping Use   Vaping status: Never Used  Substance Use Topics   Alcohol use: Yes    Alcohol/week: 1.0 standard drink of alcohol    Types: 1 Shots of liquor per week    Comment: Occasional   Drug use: No     Allergies   Patient has no known allergies.   Review of Systems Review of Systems   Physical Exam Triage Vital Signs ED Triage Vitals  Encounter Vitals Group     BP 11/01/23 0904 110/70     Systolic BP Percentile --      Diastolic BP Percentile --      Pulse Rate 11/01/23 0904 83     Resp 11/01/23 0904 17     Temp 11/01/23 0904 97.9 F (36.6 C)     Temp Source 11/01/23 0904 Oral     SpO2 11/01/23 0904 96 %     Weight  --      Height --      Head Circumference --      Peak Flow --      Pain Score 11/01/23 0905 0     Pain Loc --      Pain Education --      Exclude from Growth Chart --    No data found.  Updated Vital Signs BP 110/70 (BP Location: Left Arm)   Pulse 83   Temp 97.9 F (36.6 C) (Oral)   Resp 17   SpO2 96%   Visual Acuity Right Eye Distance:   Left Eye Distance:   Bilateral Distance:    Right Eye Near:   Left Eye Near:    Bilateral Near:     Physical Exam Vitals and nursing note reviewed.  Constitutional:      Appearance: Normal appearance. She is normal weight. She is ill-appearing.  HENT:     Head: Normocephalic and atraumatic.     Right Ear: Tympanic membrane, ear canal and external ear normal.     Left Ear: Tympanic membrane, ear canal and external ear normal.     Mouth/Throat:     Mouth: Mucous membranes are moist.     Pharynx: Oropharynx is clear.  Eyes:     Extraocular Movements: Extraocular movements intact.     Conjunctiva/sclera: Conjunctivae normal.     Pupils: Pupils are equal, round, and reactive to light.  Cardiovascular:     Rate and Rhythm: Normal rate and regular rhythm.     Pulses: Normal pulses.     Heart sounds: Normal heart sounds.  Pulmonary:     Effort: Pulmonary effort is normal.     Breath sounds: Normal breath sounds. No wheezing, rhonchi or rales.     Comments: Infrequent nonproductive cough noted on exam Musculoskeletal:        General: Normal range of motion.     Cervical back: Normal range of motion and neck supple.  Skin:    General: Skin is warm and dry.  Neurological:     General: No focal deficit present.     Mental Status: She is alert and oriented to person, place, and time. Mental status is at baseline.  Psychiatric:        Mood and Affect: Mood normal.  Behavior: Behavior normal.      UC Treatments / Results  Labs (all labs ordered are listed, but only abnormal results are displayed) Labs Reviewed - No data  to display  EKG   Radiology No results found.  Procedures Procedures (including critical care time)  Medications Ordered in UC Medications - No data to display  Initial Impression / Assessment and Plan / UC Course  I have reviewed the triage vital signs and the nursing notes.  Pertinent labs & imaging results that were available during my care of the patient were reviewed by me and considered in my medical decision making (see chart for details).     MDM: 1.  Acute URI-Rx'd doxycycline 100 mg capsule: Take 1 capsule twice daily x 7 days; 2.  Cough, unspecified type-Rx'd prednisone 20 mg tablet: Take 3 tablets p.o. daily x 5 days, Rx Tessalon capsules 200 mg: Take 1 capsule 3 times daily, as needed for cough. Advised patient to take medications as directed with food to completion.  Advised patient to take prednisone with first dose of doxycycline for the next 5 to 7 days.  Advised may use Tessalon capsules daily or as needed for cough.  Encouraged to increase daily water intake to 64 ounces per day while taking these medications.  Advised if symptoms worsen and/or unresolved please follow-up with PCP or here for further evaluation.  Patient discharged home, hemodynamically stable. Final Clinical Impressions(s) / UC Diagnoses   Final diagnoses:  Cough, unspecified type  Acute URI     Discharge Instructions      Advised patient to take medications as directed with food to completion.  Advised patient to take prednisone with first dose of doxycycline for the next 5 to 7 days.  Advised may use Tessalon capsules daily or as needed for cough.  Encouraged to increase daily water intake to 64 ounces per day while taking these medications.  Advised if symptoms worsen and/or unresolved please follow-up with PCP or here for further evaluation.     ED Prescriptions     Medication Sig Dispense Auth. Provider   doxycycline (VIBRAMYCIN) 100 MG capsule Take 1 capsule (100 mg total) by mouth 2  (two) times daily for 7 days. 14 capsule Trevor Iha, FNP   predniSONE (DELTASONE) 20 MG tablet Take 3 tabs PO daily x 5 days. 15 tablet Trevor Iha, FNP   benzonatate (TESSALON) 200 MG capsule Take 1 capsule (200 mg total) by mouth 3 (three) times daily as needed for up to 7 days. 40 capsule Trevor Iha, FNP      PDMP not reviewed this encounter.   Trevor Iha, FNP 11/01/23 1011

## 2023-11-01 NOTE — Discharge Instructions (Addendum)
Advised patient to take medications as directed with food to completion.  Advised patient to take prednisone with first dose of doxycycline for the next 5 to 7 days.  Advised may use Tessalon capsules daily or as needed for cough.  Encouraged to increase daily water intake to 64 ounces per day while taking these medications.  Advised if symptoms worsen and/or unresolved please follow-up with PCP or here for further evaluation.

## 2023-11-01 NOTE — ED Triage Notes (Signed)
Pt c/o cough and congestion x 1 week. Denies fever. Hoarse. Hx of bronchitis and pnuemonia. Taking Dayquil and OTC cough syrup prn.

## 2024-01-28 ENCOUNTER — Ambulatory Visit (INDEPENDENT_AMBULATORY_CARE_PROVIDER_SITE_OTHER): Payer: Medicare Other

## 2024-01-28 VITALS — BP 115/76 | HR 79 | Ht 67.0 in | Wt 167.0 lb

## 2024-01-28 DIAGNOSIS — Z23 Encounter for immunization: Secondary | ICD-10-CM | POA: Diagnosis not present

## 2024-01-28 DIAGNOSIS — Z0000 Encounter for general adult medical examination without abnormal findings: Secondary | ICD-10-CM | POA: Diagnosis not present

## 2024-01-28 NOTE — Progress Notes (Signed)
 Subjective:   Gabrielle Torres is a 83 y.o. female who presents for Medicare Annual (Subsequent) preventive examination.  Visit Complete: In person  Patient Medicare AWV questionnaire was completed by the patient on 01/25/2024; I have confirmed that all information answered by patient is correct and no changes since this date.  Cardiac Risk Factors include: advanced age (>71men, >46 women);smoking/ tobacco exposure;dyslipidemia     Objective:    Today's Vitals   01/28/24 0852  BP: 115/76  Pulse: 79  SpO2: 99%  Weight: 167 lb (75.8 kg)  Height: 5\' 7"  (1.702 m)   Body mass index is 26.16 kg/m.     01/28/2024    9:10 AM 01/24/2023    9:03 AM 01/19/2022   11:05 AM 11/23/2020    8:15 AM 11/23/2019    1:06 PM 11/17/2018    1:23 PM 12/28/2015    9:45 AM  Advanced Directives  Does Patient Have a Medical Advance Directive? Yes Yes Yes No No No No  Type of Advance Directive Living will Living will Living will;Healthcare Power of Attorney      Does patient want to make changes to medical advance directive? No - Patient declined No - Patient declined No - Patient declined      Copy of Healthcare Power of Attorney in Chart?   No - copy requested      Would patient like information on creating a medical advance directive?    No - Patient declined No - Patient declined No - Patient declined Yes - Educational materials given    Current Medications (verified) Outpatient Encounter Medications as of 01/28/2024  Medication Sig   alendronate (FOSAMAX) 70 MG tablet Take 1 tablet (70 mg total) by mouth every 7 (seven) days. Take with a full glass of water on an empty stomach.   aspirin 81 MG tablet Take 81 mg by mouth daily.   Cholecalciferol (VITAMIN D) 125 MCG (5000 UT) CAPS Take 5,000 Units by mouth daily.   Omega-3 Fatty Acids (FISH OIL PO) Take 3,000 mg by mouth daily.   pravastatin (PRAVACHOL) 40 MG tablet TAKE 1 TABLET BY MOUTH EVERY DAY   VITAMIN E PO Take by mouth daily.   [DISCONTINUED]  predniSONE (DELTASONE) 20 MG tablet Take 3 tabs PO daily x 5 days.   No facility-administered encounter medications on file as of 01/28/2024.    Allergies (verified) Patient has no known allergies.   History: Past Medical History:  Diagnosis Date   Hyperlipidemia    Past Surgical History:  Procedure Laterality Date   CHOLECYSTECTOMY     KNEE ARTHROSCOPY Left 01/2021   Family History  Problem Relation Age of Onset   Depression Father    Suicidality Father    Breast cancer Mother 37   Hypertension Son    Hypertension Paternal Aunt    Atrial fibrillation Daughter    Social History   Socioeconomic History   Marital status: Widowed    Spouse name: Not on file   Number of children: 3   Years of education: 35   Highest education level: 12th grade  Occupational History   Occupation: retired    Comment: housekeeper  Tobacco Use   Smoking status: Former   Smokeless tobacco: Never   Tobacco comments:    quit 50 years ago  Vaping Use   Vaping status: Never Used  Substance and Sexual Activity   Alcohol use: Yes    Alcohol/week: 1.0 standard drink of alcohol    Types: 1 Shots  of liquor per week    Comment: Occasional   Drug use: No   Sexual activity: Not Currently    Birth control/protection: None, Post-menopausal  Other Topics Concern   Not on file  Social History Narrative   Does [place] [employer] 2 times a week. Plays cards with friends during the week. Helps take Care of daughters horse 2 times a month. Very active. Does a lot of games, reading. Lives with her daughter.    Social Drivers of Corporate investment banker Strain: Low Risk  (01/28/2024)   Overall Financial Resource Strain (CARDIA)    Difficulty of Paying Living Expenses: Not hard at all  Food Insecurity: No Food Insecurity (01/28/2024)   Hunger Vital Sign    Worried About Running Out of Food in the Last Year: Never true    Ran Out of Food in the Last Year: Never true  Transportation Needs: No Transportation  Needs (01/28/2024)   PRAPARE - Administrator, Civil Service (Medical): No    Lack of Transportation (Non-Medical): No  Physical Activity: Sufficiently Active (01/28/2024)   Exercise Vital Sign    Days of Exercise per Week: 4 days    Minutes of Exercise per Session: 60 min  Stress: No Stress Concern Present (01/28/2024)   [employer] of Occupational Health - Occupational Stress Questionnaire    Feeling of Stress : Not at all  Social Connections: Moderately Isolated (01/28/2024)   Social Connection and Isolation Panel [NHANES]    Frequency of Communication with Friends and Family: More than three times a week    Frequency of Social Gatherings with Friends and Family: More than three times a week    Attends Religious Services: Never    Database administrator or Organizations: Yes    Attends Engineer, structural: More than 4 times per year    Marital Status: Widowed    Tobacco Counseling Counseling given: Not Answered Tobacco comments: quit 50 years ago   Clinical Intake:  Pre-visit preparation completed: Yes  Pain : No/denies pain     BMI - recorded: 26.16 Nutritional Status: BMI 25 -29 Overweight Nutritional Risks: None Diabetes: No  How often do you need to have someone help you when you read instructions, pamphlets, or other written materials from your doctor or pharmacy?: 1 - Never What is the last grade level you completed in school?: 12  Interpreter Needed?: No      Activities of Daily Living    01/28/2024    9:02 AM  In your present state of health, do you have any difficulty performing the following activities:  Hearing? 0  Vision? 0  Difficulty concentrating or making decisions? 0  Walking or climbing stairs? 0  Dressing or bathing? 0  Doing errands, shopping? 0  Preparing Food and eating ? N  Using the Toilet? N  In the past six months, have you accidently leaked urine? N  Do you have problems with loss of bowel control? N   Managing your Medications? N  Managing your Finances? N  Housekeeping or managing your Housekeeping? N    Patient Care Team: Nolene Ebbs as PCP - General (Family Medicine)  Indicate any recent Medical Services you may have received from other than [facility] providers in the past year (date may be approximate).     Assessment:   This is a routine wellness examination for [place].  Hearing/Vision screen No results found.   Goals Addressed  This Visit's Progress    Social and Functional Skills Optimized       She would like to be more social.       Depression Screen    01/28/2024    9:09 AM 06/28/2023   11:31 AM 03/11/2023    3:26 PM 01/24/2023    9:03 AM 01/19/2022   11:06 AM 11/23/2020    8:27 AM 11/23/2020    8:22 AM  PHQ 2/9 Scores  PHQ - 2 Score 0 0 0 0 0 0 0    Fall Risk    01/28/2024    9:12 AM 06/28/2023   11:31 AM 03/11/2023    3:25 PM 01/24/2023    9:03 AM 01/19/2022   11:06 AM  Fall Risk   Falls in the past year? 0 0 0 0 0  Number falls in past yr: 0 0 0 0 0  Injury with Fall? 0 0 0 0 0  Risk for fall due to : No Fall Risks No Fall Risks No Fall Risks No Fall Risks No Fall Risks  Follow up Falls evaluation completed Falls evaluation completed Falls evaluation completed Falls evaluation completed Falls evaluation completed    MEDICARE RISK AT HOME: Medicare Risk at Home Any stairs in or around the home?: Yes If so, are there any without handrails?: Yes Home free of loose throw rugs in walkways, pet beds, electrical cords, etc?: No Adequate lighting in your home to reduce risk of falls?: Yes Life alert?: No Use of a cane, walker or w/c?: No Grab bars in the bathroom?: Yes Shower chair or bench in shower?: Yes Elevated toilet seat or a handicapped toilet?: No  TIMED UP AND GO:  Was the test performed?  Yes  Length of time to ambulate 10 feet: 8 sec Gait steady and fast without use of assistive device    Cognitive Function:         01/28/2024    9:14 AM 01/24/2023    9:08 AM 01/19/2022   11:17 AM 11/23/2020    8:19 AM 11/23/2019    1:14 PM  6CIT Screen  What Year? 0 points 0 points 0 points 0 points 0 points  What month? 0 points 0 points 0 points 0 points 0 points  What time? 0 points 0 points 0 points 0 points 0 points  Count back from 20 0 points 0 points 0 points 0 points 0 points  Months in reverse 2 points 0 points 0 points 2 points 0 points  Repeat phrase 2 points 0 points 0 points 2 points 0 points  Total Score 4 points 0 points 0 points 4 points 0 points    Immunizations Immunization History  Administered Date(s) Administered   Fluad Quad(high Dose 65+) 09/14/2019, 11/07/2020, 01/19/2022, 01/24/2023   Fluad Trivalent(High Dose 65+) 01/28/2024   Influenza, High Dose Seasonal PF 12/31/2017   Influenza,inj,Quad PF,6+ Mos 01/09/2017, 11/17/2018   PFIZER(Purple Top)SARS-COV-2 Vaccination 01/28/2020, 02/23/2020   Pneumococcal Conjugate-13 12/28/2015   Pneumococcal Polysaccharide-23 01/09/2017   Tdap 12/28/2015, 11/24/2017    TDAP status: Up to date  Flu Vaccine status: Up to date  Pneumococcal vaccine status: Due, Education has been provided regarding the importance of this vaccine. Advised may receive this vaccine at local pharmacy or Health Dept. Aware to provide a copy of the vaccination record if obtained from local pharmacy or Health Dept. Verbalized acceptance and understanding.  Covid-19 vaccine status: Completed vaccines  Qualifies for Shingles Vaccine? Yes   Zostavax completed No  Shingrix Completed?: No.    Education has been provided regarding the importance of this vaccine. Patient has been advised to call insurance company to determine out of pocket expense if they have not yet received this vaccine. Advised may also receive vaccine at local pharmacy or Health Dept. Verbalized acceptance and understanding.  Screening Tests Health Maintenance  Topic Date Due   Zoster Vaccines- Shingrix  (1 of 2) Never done   COVID-19 Vaccine (3 - Pfizer risk series) 03/22/2020   Medicare Annual Wellness (AWV)  01/27/2025   MAMMOGRAM  06/18/2025   DTaP/Tdap/Td (3 - Td or Tdap) 11/25/2027   Pneumonia Vaccine 67+ Years old  Completed   INFLUENZA VACCINE  Completed   DEXA SCAN  Completed   HPV VACCINES  Aged Out   Colonoscopy  Discontinued   Hepatitis C Screening  Discontinued    Health Maintenance  Health Maintenance Due  Topic Date Due   Zoster Vaccines- Shingrix (1 of 2) Never done   COVID-19 Vaccine (3 - Pfizer risk series) 03/22/2020    Colorectal cancer screening: No longer required.   Mammogram status: Completed 06/19/2023. Repeat every year  Bone Density status: Completed 06/19/2023. Results reflect: Bone density results: OSTEOPENIA. Repeat every 2 years.  Lung Cancer Screening: (Low Dose CT Chest recommended if Age 52-80 years, 20 pack-year currently smoking OR have quit w/in 15years.) does not qualify.   Lung Cancer Screening Referral: n/a  Additional Screening:  Hepatitis C Screening: does qualify; Completed 01/25/2020  Vision Screening: Recommended annual ophthalmology exams for early detection of glaucoma and other disorders of the eye. Is the patient up to date with their annual eye exam?  Yes  Who is the provider or what is the name of the office in which the patient attends annual eye exams? [facility] in [place] If pt is not established with a provider, would they like to be referred to a provider to establish care? No .   Dental Screening: Recommended annual dental exams for proper oral hygiene   Community Resource Referral / Chronic Care Management: CRR required this visit?  No   CCM required this visit?  No     Plan:     I have personally reviewed and noted the following in the patient's chart:   Medical and social history Use of alcohol, tobacco or illicit drugs  Current medications and supplements including opioid prescriptions. Patient is  not currently taking opioid prescriptions. Functional ability and status Nutritional status Physical activity Advanced directives List of other physicians Hospitalizations, surgeries, and ER visits in previous 12 months Vitals Screenings to include cognitive, depression, and falls Referrals and appointments  In addition, I have reviewed and discussed with patient certain preventive protocols, quality metrics, and best practice recommendations. A written personalized care plan for preventive services as well as general preventive health recommendations were provided to patient.     Esmond Harps, [place]   01/28/2024   After Visit Summary: (In Person-Printed) AVS printed and given to the patient  Nurse Notes:   Illeana Edick is a 83 y.o. female patient of Jomarie Longs, PA-C who had a [employer] Visit today. Lenea is Retired and lives with their daughter. She has 3 children. she reports that she is socially active and does interact with friends/family regularly. She is moderately physically active and enjoys playing cards and staying active.   Flu vaccine given today.

## 2024-01-28 NOTE — Patient Instructions (Signed)
  Gabrielle Torres , Thank you for taking time to come for your Medicare Wellness Visit. I appreciate your ongoing commitment to your health goals. Please review the following plan we discussed and let me know if I can assist you in the future.   These are the goals we discussed:  Goals       Patient Stated      Patient stated she wanted to add more fruits and vegetables into her diet and exercise more for longer periods of time.      Patient Stated (pt-stated)      Loose a few lbs.      Patient Stated (pt-stated)      Patient would like to be more active and do more tai chi so she can travel in [place] next year.      Social and Functional Skills Optimized      She would like to be more social.         This is a list of the screening recommended for you and due dates:  Health Maintenance  Topic Date Due   Zoster (Shingles) Vaccine (1 of 2) Never done   COVID-19 Vaccine (3 - Pfizer risk series) 03/22/2020   Medicare Annual Wellness Visit  01/27/2025   Mammogram  06/18/2025   DTaP/Tdap/Td vaccine (3 - Td or Tdap) 11/25/2027   Pneumonia Vaccine  Completed   Flu Shot  Completed   DEXA scan (bone density measurement)  Completed   HPV Vaccine  Aged Out   Colon Cancer Screening  Discontinued   Hepatitis C Screening  Discontinued

## 2024-05-28 ENCOUNTER — Other Ambulatory Visit: Payer: Self-pay | Admitting: Physician Assistant

## 2024-06-03 ENCOUNTER — Other Ambulatory Visit: Payer: Self-pay | Admitting: Physician Assistant

## 2024-06-03 DIAGNOSIS — E782 Mixed hyperlipidemia: Secondary | ICD-10-CM

## 2024-10-20 ENCOUNTER — Ambulatory Visit
Admission: EM | Admit: 2024-10-20 | Discharge: 2024-10-20 | Disposition: A | Discharge status: Home / Self Care | Attending: Family Medicine | Admitting: Family Medicine

## 2024-10-20 ENCOUNTER — Other Ambulatory Visit: Payer: Self-pay

## 2024-10-20 DIAGNOSIS — S61213A Laceration without foreign body of left middle finger without damage to nail, initial encounter: Secondary | ICD-10-CM

## 2024-10-20 NOTE — ED Triage Notes (Signed)
 Pt presented with left middle finger laceration  while using a knife. Pt stated incident occurred 1 hour ago.

## 2024-10-20 NOTE — Discharge Instructions (Signed)
 May become briefly wet but not soak Stitches out in 7-10 days Watch for infection

## 2024-10-20 NOTE — ED Provider Notes (Signed)
 [name]    CSN: [id number] Arrival date & time: 10/20/24  0801      History   Chief Complaint Chief Complaint  Patient presents with   Laceration    HPI Gabrielle Torres is a 83 y.o. female.   Accidentally cut palmar aspect of left hand this morning while slicing a roll.  Here for evaluation.  In good health.  Bleeding controlled with pressure.    Past Medical History:  Diagnosis Date   Hyperlipidemia     Patient Active Problem List   Diagnosis Date Noted   Anxiety 03/11/2023   Solar lentigo 06/08/2022   Chronic idiopathic constipation 06/08/2022   Urinary urgency 12/31/2019   Atrophic vaginitis 12/31/2019   Age-related osteoporosis without current pathological fracture 01/13/2019   Idiopathic hypotension 01/14/2018   Stage 3a chronic kidney disease (HCC) 02/06/2017   Hypertriglyceridemia 01/09/2017   Post-menopausal 05/09/2016   Piriformis syndrome of right side 05/09/2016   Adenomatous polyp of colon 05/07/2016   Vitamin D  deficiency 12/28/2015   Hyperlipidemia 12/28/2015    Past Surgical History:  Procedure Laterality Date   CHOLECYSTECTOMY     KNEE ARTHROSCOPY Left 01/2021    OB History     Gravida  3   Para      Term      Preterm      AB      Living  3      SAB      IAB      Ectopic      Multiple      Live Births  3            Home Medications    Prior to Admission medications   Medication Sig Start Date End Date Taking? Authorizing Provider  alendronate  (FOSAMAX ) 70 MG tablet TAKE 1 TABLET (70 MG TOTAL) BY MOUTH EVERY 7 DAYS WITH FULL GLASS WATER ON EMPTY STOMACH 06/02/24   Breeback, Jade L, PA-C  aspirin 81 MG tablet Take 81 mg by mouth daily.    [provider]  Cholecalciferol (VITAMIN D ) 125 MCG (5000 UT) CAPS Take 5,000 Units by mouth daily.    [provider]  Omega-3 Fatty Acids (FISH OIL PO) Take 3,000 mg by mouth daily.    [provider]  pravastatin  (PRAVACHOL ) 40 MG tablet  TAKE 1 TABLET BY MOUTH EVERY DAY 06/03/24   Breeback, Jade L, PA-C  VITAMIN E PO Take by mouth daily.    [provider]    Family History Family History  Problem Relation Age of Onset   Depression Father    Suicidality Father    Breast cancer Mother 62   Hypertension Son    Hypertension Paternal Aunt    Atrial fibrillation Daughter     Social History Social History   Tobacco Use   Smoking status: Former   Smokeless tobacco: Never   Tobacco comments:    quit 50 years ago  Vaping Use   Vaping status: Never Used  Substance Use Topics   Alcohol use: Yes    Alcohol/week: 1.0 standard drink of alcohol    Types: 1 Shots of liquor per week    Comment: Occasional   Drug use: No     Allergies   Patient has no known allergies.   Review of Systems Review of Systems See HPI  Physical Exam Triage Vital Signs ED Triage Vitals  Encounter Vitals Group     BP 10/20/24 0827 114/73  Girls Systolic BP Percentile --      Girls Diastolic BP Percentile --      Boys Systolic BP Percentile --      Boys Diastolic BP Percentile --      Pulse Rate 10/20/24 0827 63     Resp 10/20/24 0827 18     Temp 10/20/24 0827 97.7 F (36.5 C)     Temp Source 10/20/24 0827 Oral     SpO2 10/20/24 0827 98 %     Weight 10/20/24 0830 155 lb (70.3 kg)     Height 10/20/24 0830 5' 8 (1.727 m)     Head Circumference --      Peak Flow --      Pain Score 10/20/24 0830 4     Pain Loc --      Pain Education --      Exclude from Growth Chart --    No data found.  Updated Vital Signs BP 114/73 (BP Location: Right Arm)   Pulse 63   Temp 97.7 F (36.5 C) (Oral)   Resp 18   Ht 5' 8 (1.727 m)   Wt 70.3 kg   SpO2 98%   BMI 23.57 kg/m       Physical Exam Constitutional:      General: She is not in acute distress.    Appearance: She is well-developed.  HENT:     Head: Normocephalic and atraumatic.  Eyes:     Conjunctiva/sclera: Conjunctivae normal.     Pupils: Pupils are equal,  round, and reactive to light.  Cardiovascular:     Rate and Rhythm: Normal rate.  Pulmonary:     Effort: Pulmonary effort is normal. No respiratory distress.  Abdominal:     General: There is no distension.     Palpations: Abdomen is soft.  Musculoskeletal:        General: Normal range of motion.     Cervical back: Normal range of motion.  Skin:    General: Skin is warm and dry.     Findings: Lesion present.     Comments: On the palm of the left hand, long finger, proximal phalanx there is a straight laceration that measures 2-1/2 cm  Neurological:     Mental Status: She is alert.      UC Treatments / Results  Labs (all labs ordered are listed, but only abnormal results are displayed) Labs Reviewed - No data to display  EKG   Radiology No results found.  Procedures Laceration Repair  Date/Time: 10/20/2024 9:49 AM  Performed by: Maranda Jamee Jacob, MD Authorized by: Maranda Jamee Jacob, MD   Consent:    Consent obtained:  Verbal   Consent given by:  Patient   Risks discussed:  Infection Universal protocol:    Patient identity confirmed:  Verbally with patient Anesthesia:    Anesthesia method:  Local infiltration   Local anesthetic:  Lidocaine  1% w/o epi Laceration details:    Location:  Finger   Finger location:  L long finger   Length (cm):  2.5   Depth (mm):  5 Pre-procedure details:    Preparation:  Patient was prepped and draped in usual sterile fashion Exploration:    Hemostasis achieved with:  Direct pressure   Imaging outcome: foreign body not noted     Wound exploration: wound explored through full range of motion and entire depth of wound visualized     Wound extent: no tendon damage noted     Contaminated: no  Treatment:    Area cleansed with:  Povidone-iodine   Amount of cleaning:  Standard   Debridement:  None   Undermining:  None Skin repair:    Repair method:  Sutures   Suture size:  4-0   Suture material:  Nylon   Suture technique:   Simple interrupted   Number of sutures:  3 Approximation:    Approximation:  Close Repair type:    Repair type:  Simple Post-procedure details:    Dressing:  Antibiotic ointment and non-adherent dressing   Procedure completion:  Tolerated  (including critical care time)  Medications Ordered in UC Medications - No data to display  Initial Impression / Assessment and Plan / UC Course  I have reviewed the triage vital signs and the nursing notes.  Pertinent labs & imaging results that were available during my care of the patient were reviewed by me and considered in my medical decision making (see chart for details).     Wound care discussed Final Clinical Impressions(s) / UC Diagnoses   Final diagnoses:  Laceration of left middle finger without foreign body without damage to nail, initial encounter     Discharge Instructions      May become briefly wet but not soak Stitches out in 7-10 days Watch for infection   ED Prescriptions   None    PDMP not reviewed this encounter.   Maranda Jamee Jacob, MD 10/20/24 [phone]

## 2024-10-28 ENCOUNTER — Ambulatory Visit
Admission: RE | Admit: 2024-10-28 | Discharge: 2024-10-28 | Disposition: A | Discharge status: Home / Self Care | Attending: Family Medicine | Admitting: Family Medicine

## 2024-10-28 VITALS — BP 104/70 | HR 67 | Temp 98.0°F | Resp 18

## 2024-10-28 DIAGNOSIS — Z4802 Encounter for removal of sutures: Secondary | ICD-10-CM

## 2024-10-28 NOTE — ED Provider Notes (Signed)
 Wound appears to be healing well.  3 sutures are in place.  Skin is clean dry and intact.  No evidence of infection   Maranda Jamee Jacob, MD 10/28/24 1357

## 2024-10-28 NOTE — ED Triage Notes (Signed)
 Patient here for suture removal from left middle finger.  Sutures placed on 10/20/2024.

## 2024-11-11 ENCOUNTER — Ambulatory Visit
Admission: RE | Admit: 2024-11-11 | Discharge: 2024-11-11 | Disposition: A | Discharge status: Home / Self Care | Attending: Family Medicine | Admitting: Family Medicine

## 2024-11-11 ENCOUNTER — Other Ambulatory Visit: Payer: Self-pay

## 2024-11-11 VITALS — BP 111/72 | HR 69 | Temp 97.9°F | Resp 18 | Ht 68.0 in | Wt 155.0 lb

## 2024-11-11 DIAGNOSIS — S61213D Laceration without foreign body of left middle finger without damage to nail, subsequent encounter: Secondary | ICD-10-CM

## 2024-11-11 NOTE — ED Triage Notes (Signed)
 Pt presenting with c/o pain, redness, swelling  and pressure to middle finger on her left hand. Pt stated she had suture removed 2 week ago post laceration with a knife to that said finger. No medication used for symptoms.

## 2024-11-11 NOTE — ED Provider Notes (Signed)
 [facility]    CSN: [id number] Arrival date & time: 11/11/24  1310      History   Chief Complaint Chief Complaint  Patient presents with   Finger Injury    My finger not healing right  its a little swelling  its tingle - Entered by patient    HPI Gabrielle Torres is a 83 y.o. female.   HPI  Patient is here for follow-up of her laceration.  She is concerned because the area feels firm, swollen, and she has some distal intermittent numbness.  Past Medical History:  Diagnosis Date   Hyperlipidemia     Patient Active Problem List   Diagnosis Date Noted   Anxiety 03/11/2023   Solar lentigo 06/08/2022   Chronic idiopathic constipation 06/08/2022   Urinary urgency 12/31/2019   Atrophic vaginitis 12/31/2019   Age-related osteoporosis without current pathological fracture 01/13/2019   Idiopathic hypotension 01/14/2018   Stage 3a chronic kidney disease (HCC) 02/06/2017   Hypertriglyceridemia 01/09/2017   Post-menopausal 05/09/2016   Piriformis syndrome of right side 05/09/2016   Adenomatous polyp of colon 05/07/2016   Vitamin D  deficiency 12/28/2015   Hyperlipidemia 12/28/2015    Past Surgical History:  Procedure Laterality Date   CHOLECYSTECTOMY     KNEE ARTHROSCOPY Left 01/2021    OB History     Gravida  3   Para      Term      Preterm      AB      Living  3      SAB      IAB      Ectopic      Multiple      Live Births  3            Home Medications    Prior to Admission medications   Medication Sig Start Date End Date Taking? Authorizing Provider  alendronate  (FOSAMAX ) 70 MG tablet TAKE 1 TABLET (70 MG TOTAL) BY MOUTH EVERY 7 DAYS WITH FULL GLASS WATER ON EMPTY STOMACH 06/02/24   Breeback, Jade L, PA-C  aspirin 81 MG tablet Take 81 mg by mouth daily.    [provider]  Cholecalciferol (VITAMIN D ) 125 MCG (5000 UT) CAPS Take 5,000 Units by mouth daily.    [provider]  Omega-3 Fatty Acids (FISH OIL PO) Take  3,000 mg by mouth daily.    [provider]  pravastatin  (PRAVACHOL ) 40 MG tablet TAKE 1 TABLET BY MOUTH EVERY DAY 06/03/24   Breeback, Jade L, PA-C  VITAMIN E PO Take by mouth daily.    [provider]    Family History Family History  Problem Relation Age of Onset   Depression Father    Suicidality Father    Breast cancer Mother 55   Hypertension Son    Hypertension Paternal Aunt    Atrial fibrillation Daughter     Social History Social History   Tobacco Use   Smoking status: Former   Smokeless tobacco: Never   Tobacco comments:    quit 50 years ago  Vaping Use   Vaping status: Never Used  Substance Use Topics   Alcohol use: Yes    Alcohol/week: 1.0 standard drink of alcohol    Types: 1 Shots of liquor per week    Comment: Occasional   Drug use: No     Allergies   Patient has no known allergies.   Review of Systems Review of Systems See HPI  Physical Exam Triage Vital  Signs ED Triage Vitals  Encounter Vitals Group     BP 11/11/24 1417 111/72     Girls Systolic BP Percentile --      Girls Diastolic BP Percentile --      Boys Systolic BP Percentile --      Boys Diastolic BP Percentile --      Pulse Rate 11/11/24 1417 69     Resp 11/11/24 1417 18     Temp 11/11/24 1417 97.9 F (36.6 C)     Temp Source 11/11/24 1417 Oral     SpO2 11/11/24 1417 98 %     Weight 11/11/24 1420 155 lb (70.3 kg)     Height 11/11/24 1420 5' 8 (1.727 m)     Head Circumference --      Peak Flow --      Pain Score 11/11/24 1419 0     Pain Loc --      Pain Education --      Exclude from Growth Chart --    No data found.  Updated Vital Signs BP 111/72 (BP Location: Right Arm)   Pulse 69   Temp 97.9 F (36.6 C) (Oral)   Resp 18   Ht 5' 8 (1.727 m)   Wt 70.3 kg   SpO2 98%   BMI 23.57 kg/m       Physical Exam Constitutional:      General: She is not in acute distress.    Appearance: She is well-developed.  HENT:     Head: Normocephalic and  atraumatic.  Eyes:     Conjunctiva/sclera: Conjunctivae normal.     Pupils: Pupils are equal, round, and reactive to light.  Cardiovascular:     Rate and Rhythm: Normal rate.  Pulmonary:     Effort: Pulmonary effort is normal. No respiratory distress.  Abdominal:     General: There is distension.  Musculoskeletal:        General: Normal range of motion.     Cervical back: Normal range of motion.  Skin:    General: Skin is warm and dry.     Comments: Left hand is examined.  On the palmar aspect of the long finger, PIP there is a healed laceration.  The skin is thickened.  There is soft tissue swelling.  Neurological:     Mental Status: She is alert.      UC Treatments / Results  Labs (all labs ordered are listed, but only abnormal results are displayed) Labs Reviewed - No data to display  EKG   Radiology No results found.  Procedures Procedures (including critical care time)  Medications Ordered in UC Medications - No data to display  Initial Impression / Assessment and Plan / UC Course  I have reviewed the triage vital signs and the nursing notes.  Pertinent labs & imaging results that were available during my care of the patient were reviewed by me and considered in my medical decision making (see chart for details).    Discussed that her laceration was not in the area of digital nerves.  Her numbness is probably from the persistent swelling.  Discussed silicone sleeves to help with the scar.  Scar massage may help.  Follow-up as needed  Final Clinical Impressions(s) / UC Diagnoses   Final diagnoses:  Laceration of left middle finger without foreign body without damage to nail, subsequent encounter     Discharge Instructions      Scar massage a couple times a day with Vaseline or  Aquaphor may help A silicone wrap or sleeve may help moisturize the area ( see pic for example.  Get through your pharmcy or [facility]lvester) Expect improvement as the swelling goes  down Call for problems   ED Prescriptions   None    PDMP not reviewed this encounter.   Maranda Jamee Jacob, MD 11/11/24 [phone]

## 2024-11-11 NOTE — Discharge Instructions (Addendum)
 Scar massage a couple times a day with Vaseline or Aquaphor may help A silicone wrap or sleeve may help moisturize the area ( see pic for example.  Get through your pharmcy or sylvester) Expect improvement as the swelling goes down Call for problems

## 2025-02-01 ENCOUNTER — Ambulatory Visit: Payer: PRIVATE HEALTH INSURANCE
